# Patient Record
Sex: Female | Born: 1990 | Race: White | Hispanic: No | Marital: Single | Smoking: Never smoker
Health system: Southern US, Community
[De-identification: ages and names within clinical notes are randomized; demographics above are authoritative.]

## PROBLEM LIST (undated history)

## (undated) ENCOUNTER — Inpatient Hospital Stay (HOSPITAL_COMMUNITY): Payer: Self-pay

## (undated) DIAGNOSIS — K429 Umbilical hernia without obstruction or gangrene: Secondary | ICD-10-CM

---
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
## 2010-04-02 ENCOUNTER — Inpatient Hospital Stay (HOSPITAL_COMMUNITY)
Admission: AD | Admit: 2010-04-02 | Discharge: 2010-04-02 | Payer: Self-pay | Source: Home / Self Care | Attending: Obstetrics & Gynecology | Admitting: Obstetrics & Gynecology

## 2010-04-03 ENCOUNTER — Inpatient Hospital Stay (HOSPITAL_COMMUNITY)
Admission: AD | Admit: 2010-04-03 | Discharge: 2010-04-03 | Payer: Self-pay | Source: Home / Self Care | Attending: Obstetrics & Gynecology | Admitting: Obstetrics & Gynecology

## 2010-04-04 ENCOUNTER — Inpatient Hospital Stay (HOSPITAL_COMMUNITY)
Admission: AD | Admit: 2010-04-04 | Discharge: 2010-04-06 | Payer: Self-pay | Source: Home / Self Care | Attending: Obstetrics & Gynecology | Admitting: Obstetrics & Gynecology

## 2010-04-05 LAB — CBC
HCT: 38.2 % (ref 36.0–46.0)
Hemoglobin: 13.3 g/dL (ref 12.0–15.0)
MCH: 31.4 pg (ref 26.0–34.0)
MCHC: 34.8 g/dL (ref 30.0–36.0)
MCV: 90.3 fL (ref 78.0–100.0)
Platelets: 211 10*3/uL (ref 150–400)
RBC: 4.23 MIL/uL (ref 3.87–5.11)
RDW: 13 % (ref 11.5–15.5)
WBC: 21.4 10*3/uL — ABNORMAL HIGH (ref 4.0–10.5)

## 2010-04-05 LAB — ABO/RH: ABO/RH(D): O POS

## 2010-04-05 LAB — RPR: RPR Ser Ql: NONREACTIVE

## 2010-04-07 LAB — CBC
HCT: 33.3 % — ABNORMAL LOW (ref 36.0–46.0)
Hemoglobin: 11.1 g/dL — ABNORMAL LOW (ref 12.0–15.0)
MCH: 30.5 pg (ref 26.0–34.0)
MCHC: 33.3 g/dL (ref 30.0–36.0)
MCV: 91.5 fL (ref 78.0–100.0)
Platelets: 178 10*3/uL (ref 150–400)
RBC: 3.64 MIL/uL — ABNORMAL LOW (ref 3.87–5.11)
RDW: 13.2 % (ref 11.5–15.5)
WBC: 16.1 10*3/uL — ABNORMAL HIGH (ref 4.0–10.5)

## 2010-05-07 ENCOUNTER — Other Ambulatory Visit: Payer: Self-pay | Admitting: Obstetrics & Gynecology

## 2010-08-06 NOTE — Op Note (Signed)
NAME:  Shelley Moreno, Shelley Moreno NO.:  [id number]   MEDICAL RECORD NO.:  [id number]          PATIENT TYPE:  AMB   LOCATION:  SDS                          FACILITY:  [facility]   PHYSICIAN:  Jon Gills, M.D.  DATE OF BIRTH:  November 30, 1990   DATE OF PROCEDURE:  12/31/2004  DATE OF DISCHARGE:  12/31/2004                                 OPERATIVE REPORT   PREOPERATIVE DIAGNOSIS:  Periumbilical abdominal pain.   POSTOPERATIVE DIAGNOSIS:  Helicobacter gastritis.   OPERATION:  Upper GI endoscopy with biopsy.   SURGEON:  Jon Gills, M.D.   ASSISTANT:  None.   DESCRIPTION OF FINDINGS:  Following informed consent, the patient was taken  to the operating room and placed under general anesthesia with continuous  cardiopulmonary monitoring. She remained in the supine position and the  Olympus endoscope was passed by mouth without difficulty. There was visual  evidence of esophagitis, duodenitis or peptic ulcer disease. Several shallow  prepyloric erosions were present in the stomach. A solitary gastric biopsy  was positive for Helicobacter. Several esophageal and duodenal biopsies were  unremarkable, although  Helicobacter was confirmed histologically on gastric  biopsies. Following completion of the procedure, the patient was awakened,  taken recovery room in satisfactory condition.  She will be released to the  care of her family later today.  Shelley Moreno was given a prescription for a 14-day  course of Prevpac to treat her Helicobacter infection. A 6-week return  appointment was established so that I could continue to monitor her  progress.   DESCRIPTION TECHNICAL PROCEDURE USED:  Olympus GIF-160 endoscope with cold  biopsy forceps.   DESCRIPTION OF SPECIMENS REMOVED:  Esophagus x3 in formalin, gastrics x2 in  formalin, x1 for CLOtest, duodenum x3 in formalin.           ______________________________  Jon Gills, M.D.     JHC/MEDQ  D:  01/26/2005  T:  01/26/2005  Job:   [id number]   cc:   Maryellen Pile, M.D.  [address]  [address]  [place], [place] [address]

## 2011-05-25 ENCOUNTER — Encounter (HOSPITAL_COMMUNITY): Payer: Self-pay

## 2011-05-25 ENCOUNTER — Emergency Department (INDEPENDENT_AMBULATORY_CARE_PROVIDER_SITE_OTHER)
Admission: EM | Admit: 2011-05-25 | Discharge: 2011-05-25 | Disposition: A | Discharge status: Home / Self Care | Payer: 59 | Source: Home / Self Care | Attending: Family Medicine | Admitting: Family Medicine

## 2011-05-25 DIAGNOSIS — J329 Chronic sinusitis, unspecified: Secondary | ICD-10-CM

## 2011-05-25 DIAGNOSIS — R05 Cough: Secondary | ICD-10-CM

## 2011-05-25 MED ORDER — AZITHROMYCIN 250 MG PO TABS: 250.0000 mg | ORAL_TABLET | Freq: Every day | ORAL | Status: AC

## 2011-05-25 MED ORDER — FLUTICASONE PROPIONATE 50 MCG/ACT NA SUSP: 2.0000 | Freq: Every day | NASAL | Status: DC

## 2011-05-25 MED ORDER — HYDROCOD POLST-CHLORPHEN POLST 10-8 MG/5ML PO LQCR: ORAL | Status: DC

## 2011-05-25 MED ORDER — GUAIFENESIN-CODEINE 100-10 MG/5ML PO SYRP: 5.0000 mL | ORAL_SOLUTION | Freq: Three times a day (TID) | ORAL | Status: AC | PRN

## 2011-05-25 NOTE — ED Notes (Signed)
States she has been sick for past 3 days w cough, facial discomfort, green/yellow secretions, hearing muffled,ST; reddened post nasopharynx w miimal swelling on inspection

## 2011-05-25 NOTE — Discharge Instructions (Signed)
Take antibiotics as directed. Continue to use nasal spray as directed and as needed. Use cough syrup as needed for cough; do not drive or work while taking. I recommend aggressive fever control with acetaminophen (Tylenol) and/or ibuprofen. You may use these together, alternating them every 4 hours, or individually, every 8 hours. For example, take acetaminophen 500 to 1000 mg at 12 noon, then 600 to 800 mg of ibuprofen at 4 pm, then acetaminophen at 8 pm, etc. Also, stay hydrated with clear liquids. Return to care should your symptoms not improve, or worsen in any way.

## 2011-05-25 NOTE — ED Provider Notes (Signed)
History     CSN: [id number]  Arrival date & time 05/25/11  1229   First MD Initiated Contact with Patient 05/25/11 1334      Chief Complaint  Patient presents with  . Cough    (Consider location/radiation/quality/duration/timing/severity/associated sxs/prior treatment) HPI Comments: Memory presents for evaluation of 3 days of nasal congestion, rhinorrhea, purulent drainage, postnasal drainage, cough, sinus pressure, and bilateral ear pain. She reports a fever of 101F yesterday. She's been taking Tylenol with minimal relief.  Patient is a 21 y.o. female presenting with cough. The history is provided by the patient.  Cough This is a new problem. The current episode started more than 2 days ago. The problem occurs constantly. The cough is productive of sputum. The maximum temperature recorded prior to her arrival was 101 to 101.9 F. Associated symptoms include chills, sweats, ear congestion, ear pain, rhinorrhea and sore throat.    History reviewed. No pertinent past medical history.  History reviewed. No pertinent past surgical history.  History reviewed. No pertinent family history.  History  Substance Use Topics  . Smoking status: Never Smoker   . Smokeless tobacco: Not on file  . Alcohol Use: Yes    OB History    Grav Para Term Preterm Abortions TAB SAB Ect Mult Living                  Review of Systems  Constitutional: Positive for fever and chills.  HENT: Positive for ear pain, congestion, sore throat, rhinorrhea and sinus pressure.   Eyes: Negative.   Respiratory: Positive for cough.   Cardiovascular: Negative.   Gastrointestinal: Negative.   Genitourinary: Negative.   Musculoskeletal: Negative.   Skin: Negative.   Neurological: Negative.     Allergies  Review of patient's allergies indicates no known allergies.  Home Medications   Current Outpatient Rx  Name Route Sig Dispense Refill  . AZITHROMYCIN 250 MG PO TABS Oral Take 1 tablet (250 mg total) by  mouth daily. Take two tablets on first day, then one tablet each day for four days 6 tablet 0  . HYDROCOD POLST-CPM POLST ER 10-8 MG/5ML PO LQCR  Take 5 ml to 10 ml every 12 hours as needed for cough 140 mL 0  . FLUTICASONE PROPIONATE 50 MCG/ACT NA SUSP Nasal Place 2 sprays into the nose daily. 16 g 2    BP 154/87  Pulse 93  Temp(Src) 98.4 F (36.9 C) (Oral)  Resp 18  SpO2 100%  LMP 05/06/2011  Physical Exam  Nursing note and vitals reviewed. Constitutional: She is oriented to person, place, and time. She appears well-developed and well-nourished.  HENT:  Head: Normocephalic and atraumatic.  Right Ear: Tympanic membrane is retracted.  Left Ear: Tympanic membrane is erythematous and retracted.  Mouth/Throat: Uvula is midline and mucous membranes are normal. Posterior oropharyngeal erythema present.  Eyes: EOM are normal.  Neck: Normal range of motion.  Pulmonary/Chest: Effort normal and breath sounds normal. She has no decreased breath sounds. She has no wheezes. She has no rhonchi.  Musculoskeletal: Normal range of motion.  Neurological: She is alert and oriented to person, place, and time.  Skin: Skin is warm and dry.  Psychiatric: Her behavior is normal.    ED Course  Procedures (including critical care time)  Labs Reviewed - No data to display No results found.   1. Rhinosinusitis   2. Cough       MDM  rx given for z-pack, fluticasone, guaifenesin AC  Richardo Priest, MD 05/25/11 1546

## 2013-01-03 ENCOUNTER — Emergency Department (HOSPITAL_COMMUNITY)
Admission: EM | Admit: 2013-01-03 | Discharge: 2013-01-03 | Disposition: A | Discharge status: Home / Self Care | Payer: 59 | Attending: Emergency Medicine | Admitting: Emergency Medicine

## 2013-01-03 ENCOUNTER — Encounter (HOSPITAL_COMMUNITY): Payer: Self-pay | Admitting: Emergency Medicine

## 2013-01-03 DIAGNOSIS — O9989 Other specified diseases and conditions complicating pregnancy, childbirth and the puerperium: Secondary | ICD-10-CM | POA: Insufficient documentation

## 2013-01-03 DIAGNOSIS — J329 Chronic sinusitis, unspecified: Secondary | ICD-10-CM | POA: Insufficient documentation

## 2013-01-03 DIAGNOSIS — R05 Cough: Secondary | ICD-10-CM | POA: Insufficient documentation

## 2013-01-03 DIAGNOSIS — J029 Acute pharyngitis, unspecified: Secondary | ICD-10-CM | POA: Diagnosis not present

## 2013-01-03 DIAGNOSIS — IMO0002 Reserved for concepts with insufficient information to code with codable children: Secondary | ICD-10-CM | POA: Diagnosis not present

## 2013-01-03 DIAGNOSIS — R059 Cough, unspecified: Secondary | ICD-10-CM | POA: Insufficient documentation

## 2013-01-03 DIAGNOSIS — H9209 Otalgia, unspecified ear: Secondary | ICD-10-CM | POA: Insufficient documentation

## 2013-01-03 MED ORDER — AMOXICILLIN 500 MG PO CAPS: 500.0000 mg | ORAL_CAPSULE | Freq: Three times a day (TID) | ORAL | Status: DC

## 2013-01-03 NOTE — ED Provider Notes (Signed)
CSN: [id number]     Arrival date & time 01/03/13  2017 History   This chart was scribed for non-physician practitioner Mckinley Jewel, PA-C working with Vida Roller, MD by Clydene Laming, ED Scribe. This patient was seen in room TR04C/TR04C and the patient's care was started at 10:00 PM.    No chief complaint on file.  HPI HPI Comments: Shelley Moreno is a 22 y.o. female who presents to the Emergency Department complaining of a cough onset two days ago with an associated sore throat and bilateral ear pain. Pt reports being 3 mos pregnant. Pt denies fever or chills.   History reviewed. No pertinent past medical history. History reviewed. No pertinent past surgical history. No family history on file. History  Substance Use Topics  . Smoking status: Never Smoker   . Smokeless tobacco: Not on file  . Alcohol Use: Yes   OB History   Grav Para Term Preterm Abortions TAB SAB Ect Mult Living   1              Review of Systems  HENT: Positive for ear pain, sinus pressure and sore throat.   Respiratory: Positive for cough.   All other systems reviewed and are negative.    Allergies  Review of patient's allergies indicates no known allergies.  Home Medications   Current Outpatient Rx  Name  Route  Sig  Dispense  Refill  . guaiFENesin (ROBITUSSIN) 100 MG/5ML liquid   Oral   Take 200 mg by mouth 3 (three) times daily as needed for cough.         . pseudoephedrine (SUDAFED) 30 MG tablet   Oral   Take 60 mg by mouth every 4 (four) hours as needed for congestion.         Marland Kitchen EXPIRED: fluticasone (FLONASE) 50 MCG/ACT nasal spray   Nasal   Place 2 sprays into the nose daily.   16 g   2    Triage Vitals: BP 122/72  Pulse 88  Temp(Src) 98.8 F (37.1 C) (Oral)  Resp 18  SpO2 100% Physical Exam  Nursing note and vitals reviewed. Constitutional: She is oriented to person, place, and time. She appears well-developed and well-nourished. No distress.  HENT:  Head: Normocephalic  and atraumatic.  Eyes: EOM are normal.  Neck: Neck supple. No tracheal deviation present.  Cardiovascular: Normal rate.   Pulmonary/Chest: Effort normal. No respiratory distress.  Musculoskeletal: Normal range of motion.  Neurological: She is alert and oriented to person, place, and time.  Skin: Skin is warm and dry.  Psychiatric: She has a normal mood and affect. Her behavior is normal.    ED Course  Procedures (including critical care time) DIAGNOSTIC STUDIES: Oxygen Saturation is 100% on RA, normal by my interpretation.    COORDINATION OF CARE: 10:05 PM- Discussed treatment plan with pt at bedside. Pt verbalized understanding and agreement with plan.   Labs Review Labs Reviewed - No data to display Imaging Review No results found.  EKG Interpretation   None       MDM   1. Sinusitis     11:44 PM Patient will have amoxicillin for sinusitis which is safe in pregnancy. Vitals stable and patient afebrile. Patient instructed to return with worsening or concerning symptoms.   I personally performed the services described in this documentation, which was scribed in my presence. The recorded information has been reviewed and is accurate.     Emilia Beck, PA-C 01/03/13 2345

## 2013-01-03 NOTE — ED Notes (Signed)
Cough for few days; and bilateral ears hurt. ? Sinus infection. X 3 mos. Pregnant.

## 2013-01-04 NOTE — ED Provider Notes (Signed)
Medical screening examination/treatment/procedure(s) were performed by non-physician practitioner and as supervising physician I was immediately available for consultation/collaboration.    Vida Roller, MD 01/04/13 [phone]

## 2013-01-31 LAB — OB RESULTS CONSOLE ABO/RH: RH Type: POSITIVE

## 2013-01-31 LAB — OB RESULTS CONSOLE HEPATITIS B SURFACE ANTIGEN: Hepatitis B Surface Ag: NEGATIVE

## 2013-01-31 LAB — OB RESULTS CONSOLE GBS: GBS: NEGATIVE

## 2013-01-31 LAB — OB RESULTS CONSOLE RPR: RPR: NONREACTIVE

## 2013-01-31 LAB — OB RESULTS CONSOLE ANTIBODY SCREEN: Antibody Screen: NEGATIVE

## 2013-01-31 LAB — OB RESULTS CONSOLE RUBELLA ANTIBODY, IGM: Rubella: IMMUNE

## 2013-01-31 LAB — OB RESULTS CONSOLE GC/CHLAMYDIA
Chlamydia: NEGATIVE
Gonorrhea: NEGATIVE

## 2013-01-31 LAB — OB RESULTS CONSOLE HIV ANTIBODY (ROUTINE TESTING): HIV: NONREACTIVE

## 2013-02-11 ENCOUNTER — Inpatient Hospital Stay (HOSPITAL_COMMUNITY)
Admission: AD | Admit: 2013-02-11 | Discharge: 2013-02-11 | Disposition: A | Discharge status: Home / Self Care | Payer: 59 | Source: Ambulatory Visit | Attending: Obstetrics and Gynecology | Admitting: Obstetrics and Gynecology

## 2013-02-11 ENCOUNTER — Encounter (HOSPITAL_COMMUNITY): Payer: Self-pay | Admitting: *Deleted

## 2013-02-11 DIAGNOSIS — R142 Eructation: Secondary | ICD-10-CM | POA: Insufficient documentation

## 2013-02-11 DIAGNOSIS — R109 Unspecified abdominal pain: Secondary | ICD-10-CM | POA: Diagnosis present

## 2013-02-11 DIAGNOSIS — R141 Gas pain: Secondary | ICD-10-CM | POA: Diagnosis not present

## 2013-02-11 DIAGNOSIS — O99891 Other specified diseases and conditions complicating pregnancy: Secondary | ICD-10-CM | POA: Insufficient documentation

## 2013-02-11 DIAGNOSIS — R1032 Left lower quadrant pain: Secondary | ICD-10-CM | POA: Insufficient documentation

## 2013-02-11 DIAGNOSIS — N949 Unspecified condition associated with female genital organs and menstrual cycle: Secondary | ICD-10-CM

## 2013-02-11 LAB — URINALYSIS, ROUTINE W REFLEX MICROSCOPIC
Bilirubin Urine: NEGATIVE
Glucose, UA: NEGATIVE mg/dL
Hgb urine dipstick: NEGATIVE
Ketones, ur: NEGATIVE mg/dL
Leukocytes, UA: NEGATIVE
Nitrite: NEGATIVE
Protein, ur: NEGATIVE mg/dL
Specific Gravity, Urine: 1.015 (ref 1.005–1.030)
Urobilinogen, UA: 0.2 mg/dL (ref 0.0–1.0)
pH: 6 (ref 5.0–8.0)

## 2013-02-11 LAB — WET PREP, GENITAL
Clue Cells Wet Prep HPF POC: NONE SEEN
Trich, Wet Prep: NONE SEEN
Yeast Wet Prep HPF POC: NONE SEEN

## 2013-02-11 MED ORDER — DOCUSATE SODIUM 100 MG PO CAPS: 100.0000 mg | ORAL_CAPSULE | Freq: Every day | ORAL | Status: DC | PRN

## 2013-02-11 MED ORDER — CYCLOBENZAPRINE HCL 10 MG PO TABS: 10.0000 mg | ORAL_TABLET | Freq: Three times a day (TID) | ORAL | Status: DC | PRN

## 2013-02-11 NOTE — MAU Provider Note (Signed)
Chief Complaint: Abdominal Pain   First Provider Initiated Contact with Patient 02/11/13 2113.     SUBJECTIVE HPI: Shelley Moreno is a 22 y.o. G2P1001 at [redacted]w[redacted]d by LMP who presents with LLQ pain radiating around to left lower back since yesterday. Thinks it might be gas pains. Rates pain 5/10 on pain scale it worse. 1/10 on pain scale now. Denies vaginal bleeding, vaginal discharge, pelvic pressure, urinary complaints, fever chills, GI complaints. 2 normal bowel movements yesterday. None today. No relief with Gas-X. Temporary relief with warm compress. Feels better when she presses on it. Slightly worse w/ mvmt.   No history of short cervix or LEEP.  History reviewed. No pertinent past medical history. OB History  Gravida Para Term Preterm AB SAB TAB Ectopic Multiple Living  2 1 1       1     # Outcome Date GA Lbr Len/2nd Weight Sex Delivery Anes PTL Lv  2 CUR           1 TRM 04/04/10    F SVD EPI  Y     History reviewed. No pertinent past surgical history. History   Social History  . Marital Status: Single    Spouse Name: N/A    Number of Children: N/A  . Years of Education: N/A   Occupational History  . Not on file.   Social History Main Topics  . Smoking status: Never Smoker   . Smokeless tobacco: Not on file  . Alcohol Use: Yes  . Drug Use: Not on file  . Sexual Activity: Yes   Other Topics Concern  . Not on file   Social History Narrative  . No narrative on file   No current facility-administered medications on file prior to encounter.   No current outpatient prescriptions on file prior to encounter.   No Known Allergies  ROS: Pertinent items in HPI  OBJECTIVE Blood pressure 105/65, pulse 67, temperature 98.5 F (36.9 C), resp. rate 18, SpO2 100.00%. GENERAL: Well-developed, well-nourished female in no acute distress.  HEENT: Normocephalic HEART: normal rate RESP: normal effort ABDOMEN: Mildly distended, non-tender. Gravid, size equals date. Positive  bowel sounds x4. BACK: No CVA tenderness. EXTREMITIES: Nontender, no edema NEURO: Alert and oriented SPECULUM EXAM: NEFG, physiologic discharge, no blood noted, cervix clean BIMANUAL: cervix long and closed; uterus 18-week size, no adnexal tenderness or masses FHR 150 by doppler.  LAB RESULTS Results for orders placed during the hospital encounter of 02/11/13 (from the past 24 hour(s))  URINALYSIS, ROUTINE W REFLEX MICROSCOPIC     Status: None   Collection Time    02/11/13  8:40 PM      Result Value Range   Color, Urine YELLOW  YELLOW   APPearance CLEAR  CLEAR   Specific Gravity, Urine 1.015  1.005 - 1.030   pH 6.0  5.0 - 8.0   Glucose, UA NEGATIVE  NEGATIVE mg/dL   Hgb urine dipstick NEGATIVE  NEGATIVE   Bilirubin Urine NEGATIVE  NEGATIVE   Ketones, ur NEGATIVE  NEGATIVE mg/dL   Protein, ur NEGATIVE  NEGATIVE mg/dL   Urobilinogen, UA 0.2  0.0 - 1.0 mg/dL   Nitrite NEGATIVE  NEGATIVE   Leukocytes, UA NEGATIVE  NEGATIVE   IMAGING No results found.  MAU COURSE  ASSESSMENT 1. Abdominal gas pain   2. Round ligament pain    PLAN Discharge home per consult with Dr. Dareen Piano. Many ultrasound if symptoms worsen.     Follow-up Information   Follow up with  [facility]. (As scheduled or as needed if symptoms worsen)    Contact information:   [address] [place] [address] [phone]      Follow up with [facility]. (As needed emergencies)    Contact information:   [id number] [place] [address] [phone]       Medication List         cyclobenzaprine 10 MG tablet  Commonly known as:  FLEXERIL  Take 1 tablet (10 mg total) by mouth 3 (three) times daily as needed for muscle spasms.     docusate sodium 100 MG capsule  Commonly known as:  COLACE  Take 1 capsule (100 mg total) by mouth daily as needed for mild constipation or  moderate constipation. Take daily for one week, then as needed.     prenatal multivitamin Tabs tablet  Take 1 tablet by mouth daily at 12 noon.     simethicone 80 MG chewable tablet  Commonly known as:  MYLICON  Chew 80 mg by mouth once.         [place], CNM 02/11/2013  9:52 PM

## 2013-02-11 NOTE — MAU Note (Signed)
Started having abdominal pain yesterday afternoon that travel to lower back. Eased up after applying a heating pad and shower. The pain returned today. Not sure if the pain is from gas. Took Gas-X

## 2013-02-12 LAB — GC/CHLAMYDIA PROBE AMP
CT Probe RNA: NEGATIVE
GC Probe RNA: NEGATIVE

## 2013-03-21 NOTE — L&D Delivery Note (Signed)
Pt completed the first stage without complications. She pushed briefly and had a SVD of one live viable white female infant over an intact perineum in the ROA position. Nuchal cord x 1. Placenta S/I. EBL-400cc.

## 2013-07-18 ENCOUNTER — Encounter (HOSPITAL_COMMUNITY): Payer: Self-pay | Admitting: *Deleted

## 2013-07-18 ENCOUNTER — Telehealth (HOSPITAL_COMMUNITY): Payer: Self-pay | Admitting: *Deleted

## 2013-07-18 NOTE — Telephone Encounter (Signed)
Preadmission screen  

## 2013-07-19 ENCOUNTER — Encounter (HOSPITAL_COMMUNITY): Payer: Self-pay | Admitting: *Deleted

## 2013-07-19 ENCOUNTER — Inpatient Hospital Stay (HOSPITAL_COMMUNITY)
Admission: AD | Admit: 2013-07-19 | Discharge: 2013-07-20 | DRG: 775 | Disposition: A | Discharge status: Home / Self Care | Payer: 59 | Source: Ambulatory Visit | Attending: Obstetrics and Gynecology | Admitting: Obstetrics and Gynecology

## 2013-07-19 ENCOUNTER — Encounter (HOSPITAL_COMMUNITY): Payer: 59 | Admitting: Anesthesiology

## 2013-07-19 ENCOUNTER — Inpatient Hospital Stay (HOSPITAL_COMMUNITY): Payer: 59 | Admitting: Anesthesiology

## 2013-07-19 DIAGNOSIS — Z3480 Encounter for supervision of other normal pregnancy, unspecified trimester: Secondary | ICD-10-CM

## 2013-07-19 DIAGNOSIS — IMO0001 Reserved for inherently not codable concepts without codable children: Secondary | ICD-10-CM

## 2013-07-19 DIAGNOSIS — O479 False labor, unspecified: Secondary | ICD-10-CM | POA: Diagnosis present

## 2013-07-19 LAB — CBC
HCT: 37.2 % (ref 36.0–46.0)
HEMOGLOBIN: 13 g/dL (ref 12.0–15.0)
MCH: 31.6 pg (ref 26.0–34.0)
MCHC: 34.9 g/dL (ref 30.0–36.0)
MCV: 90.5 fL (ref 78.0–100.0)
Platelets: 161 10*3/uL (ref 150–400)
RBC: 4.11 MIL/uL (ref 3.87–5.11)
RDW: 13.3 % (ref 11.5–15.5)
WBC: 22.9 10*3/uL — AB (ref 4.0–10.5)

## 2013-07-19 LAB — TYPE AND SCREEN
ABO/RH(D): O POS
ANTIBODY SCREEN: NEGATIVE

## 2013-07-19 LAB — RPR

## 2013-07-19 MED ORDER — LIDOCAINE HCL (PF) 1 % IJ SOLN
30.0000 mL | INTRAMUSCULAR | Status: DC | PRN
  Filled 2013-07-19: qty 30

## 2013-07-19 MED ORDER — DIPHENHYDRAMINE HCL 50 MG/ML IJ SOLN: 12.5000 mg | INTRAMUSCULAR | Status: DC | PRN

## 2013-07-19 MED ORDER — ZOLPIDEM TARTRATE 5 MG PO TABS: 5.0000 mg | ORAL_TABLET | Freq: Every evening | ORAL | Status: DC | PRN

## 2013-07-19 MED ORDER — FENTANYL 2.5 MCG/ML BUPIVACAINE 1/10 % EPIDURAL INFUSION (WH - ANES)
INTRAMUSCULAR | Status: DC | PRN
  Administered 2013-07-19: 14.5 mL/h via EPIDURAL

## 2013-07-19 MED ORDER — LACTATED RINGERS IV SOLN: 500.0000 mL | Freq: Once | INTRAVENOUS | Status: DC

## 2013-07-19 MED ORDER — PHENYLEPHRINE 40 MCG/ML (10ML) SYRINGE FOR IV PUSH (FOR BLOOD PRESSURE SUPPORT): 80.0000 ug | PREFILLED_SYRINGE | INTRAVENOUS | Status: DC | PRN

## 2013-07-19 MED ORDER — BENZOCAINE-MENTHOL 20-0.5 % EX AERO
1.0000 "application " | INHALATION_SPRAY | CUTANEOUS | Status: DC | PRN
  Administered 2013-07-19: 1 via TOPICAL
  Filled 2013-07-19 (×2): qty 56

## 2013-07-19 MED ORDER — OXYTOCIN 40 UNITS IN LACTATED RINGERS INFUSION - SIMPLE MED
62.5000 mL/h | INTRAVENOUS | Status: DC
  Administered 2013-07-19: 999 mL/h via INTRAVENOUS
  Filled 2013-07-19: qty 1000

## 2013-07-19 MED ORDER — ONDANSETRON HCL 4 MG/2ML IJ SOLN: 4.0000 mg | INTRAMUSCULAR | Status: DC | PRN

## 2013-07-19 MED ORDER — SIMETHICONE 80 MG PO CHEW: 80.0000 mg | CHEWABLE_TABLET | ORAL | Status: DC | PRN

## 2013-07-19 MED ORDER — EPHEDRINE 5 MG/ML INJ: 10.0000 mg | INTRAVENOUS | Status: DC | PRN

## 2013-07-19 MED ORDER — LACTATED RINGERS IV SOLN
INTRAVENOUS | Status: DC
Start: 2013-07-19 — End: 2013-07-19
  Administered 2013-07-19: 10:00:00 via INTRAVENOUS

## 2013-07-19 MED ORDER — OXYTOCIN BOLUS FROM INFUSION: 500.0000 mL | INTRAVENOUS | Status: DC

## 2013-07-19 MED ORDER — DIBUCAINE 1 % RE OINT
1.0000 "application " | TOPICAL_OINTMENT | RECTAL | Status: DC | PRN
  Filled 2013-07-19: qty 28

## 2013-07-19 MED ORDER — ONDANSETRON HCL 4 MG/2ML IJ SOLN: 4.0000 mg | Freq: Four times a day (QID) | INTRAMUSCULAR | Status: DC | PRN

## 2013-07-19 MED ORDER — EPHEDRINE 5 MG/ML INJ
INTRAVENOUS | Status: AC
  Filled 2013-07-19: qty 4

## 2013-07-19 MED ORDER — LACTATED RINGERS IV SOLN
500.0000 mL | INTRAVENOUS | Status: DC | PRN
  Administered 2013-07-19: 1000 mL via INTRAVENOUS

## 2013-07-19 MED ORDER — FENTANYL 2.5 MCG/ML BUPIVACAINE 1/10 % EPIDURAL INFUSION (WH - ANES): 14.5000 mL/h | INTRAMUSCULAR | Status: DC | PRN

## 2013-07-19 MED ORDER — FENTANYL 2.5 MCG/ML BUPIVACAINE 1/10 % EPIDURAL INFUSION (WH - ANES): 14.0000 mL/h | INTRAMUSCULAR | Status: DC | PRN

## 2013-07-19 MED ORDER — ONDANSETRON HCL 4 MG PO TABS: 4.0000 mg | ORAL_TABLET | ORAL | Status: DC | PRN

## 2013-07-19 MED ORDER — CITRIC ACID-SODIUM CITRATE 334-500 MG/5ML PO SOLN: 30.0000 mL | ORAL | Status: DC | PRN

## 2013-07-19 MED ORDER — PHENYLEPHRINE 40 MCG/ML (10ML) SYRINGE FOR IV PUSH (FOR BLOOD PRESSURE SUPPORT)
PREFILLED_SYRINGE | INTRAVENOUS | Status: AC
  Filled 2013-07-19: qty 10

## 2013-07-19 MED ORDER — FENTANYL 2.5 MCG/ML BUPIVACAINE 1/10 % EPIDURAL INFUSION (WH - ANES)
INTRAMUSCULAR | Status: AC
  Filled 2013-07-19: qty 125

## 2013-07-19 MED ORDER — MEASLES, MUMPS & RUBELLA VAC ~~LOC~~ INJ: 0.5000 mL | INJECTION | Freq: Once | SUBCUTANEOUS | Status: DC

## 2013-07-19 MED ORDER — IBUPROFEN 600 MG PO TABS: 600.0000 mg | ORAL_TABLET | Freq: Four times a day (QID) | ORAL | Status: DC | PRN

## 2013-07-19 MED ORDER — LIDOCAINE HCL (PF) 1 % IJ SOLN
INTRAMUSCULAR | Status: DC | PRN
  Administered 2013-07-19 (×2): 4 mL

## 2013-07-19 MED ORDER — OXYCODONE-ACETAMINOPHEN 5-325 MG PO TABS: 1.0000 | ORAL_TABLET | ORAL | Status: DC | PRN

## 2013-07-19 MED ORDER — ACETAMINOPHEN 325 MG PO TABS: 650.0000 mg | ORAL_TABLET | ORAL | Status: DC | PRN

## 2013-07-19 MED ORDER — IBUPROFEN 600 MG PO TABS
600.0000 mg | ORAL_TABLET | Freq: Four times a day (QID) | ORAL | Status: DC
  Administered 2013-07-19 – 2013-07-20 (×5): 600 mg via ORAL
  Filled 2013-07-19 (×5): qty 1

## 2013-07-19 MED ORDER — FLEET ENEMA 7-19 GM/118ML RE ENEM: 1.0000 | ENEMA | RECTAL | Status: DC | PRN

## 2013-07-19 MED ORDER — TETANUS-DIPHTH-ACELL PERTUSSIS 5-2.5-18.5 LF-MCG/0.5 IM SUSP
0.5000 mL | Freq: Once | INTRAMUSCULAR | Status: DC
  Filled 2013-07-19: qty 0.5

## 2013-07-19 MED ORDER — SENNOSIDES-DOCUSATE SODIUM 8.6-50 MG PO TABS
2.0000 | ORAL_TABLET | ORAL | Status: DC
  Administered 2013-07-19: 2 via ORAL
  Filled 2013-07-19: qty 2

## 2013-07-19 MED ORDER — OXYCODONE-ACETAMINOPHEN 5-325 MG PO TABS
1.0000 | ORAL_TABLET | ORAL | Status: DC | PRN
  Administered 2013-07-19 – 2013-07-20 (×3): 1 via ORAL
  Filled 2013-07-19 (×3): qty 1

## 2013-07-19 MED ORDER — WITCH HAZEL-GLYCERIN EX PADS
1.0000 | MEDICATED_PAD | CUTANEOUS | Status: DC | PRN
Start: 2013-07-19 — End: 2013-07-20

## 2013-07-19 NOTE — MAU Note (Signed)
PT SAYS SHE HURT BAD SINCE 0500.    VE IN OFFICE  ON Thursday  3 CM.Marland Kitchen.    DENIES HSV AND MRSA.

## 2013-07-19 NOTE — Anesthesia Preprocedure Evaluation (Signed)
Anesthesia Evaluation  Patient identified by MRN, date of birth, ID band Patient awake    Reviewed: Allergy & Precautions, H&P , Patient's Chart, lab work & pertinent test results  Airway Mallampati: II TM Distance: >3 FB Neck ROM: Full    Dental no notable dental hx. (+) Poor Dentition   Pulmonary former smoker,  breath sounds clear to auscultation  Pulmonary exam normal       Cardiovascular negative cardio ROS  Rhythm:Regular Rate:Normal     Neuro/Psych negative neurological ROS  negative psych ROS   GI/Hepatic Neg liver ROS, GERD-  Medicated and Controlled,  Endo/Other  Obesity  Renal/GU negative Renal ROS  negative genitourinary   Musculoskeletal negative musculoskeletal ROS (+)   Abdominal   Peds  Hematology negative hematology ROS (+)   Anesthesia Other Findings   Reproductive/Obstetrics (+) Pregnancy                           Anesthesia Physical Anesthesia Plan  ASA: II  Anesthesia Plan: Epidural   Post-op Pain Management:    Induction:   Airway Management Planned: Natural Airway  Additional Equipment:   Intra-op Plan:   Post-operative Plan:   Informed Consent: I have reviewed the patients History and Physical, chart, labs and discussed the procedure including the risks, benefits and alternatives for the proposed anesthesia with the patient or authorized representative who has indicated his/her understanding and acceptance.     Plan Discussed with: Anesthesiologist  Anesthesia Plan Comments:         Anesthesia Quick Evaluation

## 2013-07-19 NOTE — H&P (Signed)
Pt is a 23 y/o white female, G2P1001 at term who presents to L&D c/o labor. PNC was uncomplicated.  PMHX: See hollister PE: VSSAF        HEENT-wnl        Abd-gravid, non tender        Cx-70/5/-2        FHTs- reactive IMP/ IUP at term, in labor Plan/ Admit.

## 2013-07-19 NOTE — MAU Note (Signed)
Contractions  

## 2013-07-19 NOTE — Anesthesia Procedure Notes (Signed)
Epidural Patient location during procedure: OB Start time: 07/19/2013 10:15 AM  Staffing Anesthesiologist: Tionne Dayhoff A. Performed by: anesthesiologist   Preanesthetic Checklist Completed: patient identified, site marked, surgical consent, pre-op evaluation, timeout performed, IV checked, risks and benefits discussed and monitors and equipment checked  Epidural Patient position: sitting Prep: site prepped and draped and DuraPrep Patient monitoring: continuous pulse ox and blood pressure Approach: midline Location: L3-L4 Injection technique: LOR air  Needle:  Needle type: Tuohy  Needle gauge: 17 G Needle length: 9 cm and 9 Needle insertion depth: 7 cm Catheter type: closed end flexible Catheter size: 19 Gauge Catheter at skin depth: 12 cm Test dose: negative and Other  Assessment Events: blood not aspirated, injection not painful, no injection resistance, negative IV test and no paresthesia  Additional Notes Patient identified. Risks and benefits discussed including failed block, incomplete  Pain control, post dural puncture headache, nerve damage, paralysis, blood pressure Changes, nausea, vomiting, reactions to medications-both toxic and allergic and post Partum back pain. All questions were answered. Patient expressed understanding and wished to proceed. Sterile technique was used throughout procedure. Epidural site was Dressed with sterile barrier dressing. No paresthesias, signs of intravascular injection Or signs of intrathecal spread were encountered.  Patient was more comfortable after the epidural was dosed. Please see RN's note for documentation of vital signs and FHR which are stable.

## 2013-07-20 LAB — CBC
HCT: 33.4 % — ABNORMAL LOW (ref 36.0–46.0)
Hemoglobin: 11.2 g/dL — ABNORMAL LOW (ref 12.0–15.0)
MCH: 30.9 pg (ref 26.0–34.0)
MCHC: 33.5 g/dL (ref 30.0–36.0)
MCV: 92 fL (ref 78.0–100.0)
Platelets: 156 10*3/uL (ref 150–400)
RBC: 3.63 MIL/uL — ABNORMAL LOW (ref 3.87–5.11)
RDW: 13.5 % (ref 11.5–15.5)
WBC: 18.7 10*3/uL — ABNORMAL HIGH (ref 4.0–10.5)

## 2013-07-20 MED ORDER — OXYCODONE-ACETAMINOPHEN 5-325 MG PO TABS: 1.0000 | ORAL_TABLET | ORAL | Status: DC | PRN

## 2013-07-20 NOTE — Anesthesia Postprocedure Evaluation (Signed)
Anesthesia Post Note  Patient: Shelley Moreno  Procedure(s) Performed: * No procedures listed *  Anesthesia type: Epidural  Patient location: Mother/Baby  Post pain: Pain level controlled  Post assessment: Post-op Vital signs reviewed  Last Vitals:  Filed Vitals:   07/20/13 0550  BP: 102/65  Pulse: 70  Temp: 36.8 C  Resp: 18    Post vital signs: Reviewed  Level of consciousness:alert  Complications: No apparent anesthesia complications

## 2013-07-20 NOTE — Lactation Note (Signed)
This note was copied from the chart of Shelley Corliss ParishLaura Lindeman. Lactation Consultation Note: Initial visit with this experienced BF mom. She had baby latched to breast when I went into room. He is sleepy now- few sucks noted. Mom reports that he has been nursing well but sleepy at some feedings. Reviewed normal newborn behavior the first 24 hours. Discussed feeding cues and cluster feeding with mom. Mom reports no pain with latch. BF brochure given with resources for support after DC. Reviewed BFSG and OP appointments as recsurces for support after DC. To call prn  Patient Name: Shelley Moreno ZOXWR'UToday's Date: 07/20/2013 Reason for consult: Initial assessment   Maternal Data Formula Feeding for Exclusion: No Infant to breast within first hour of birth: Yes Has patient been taught Hand Expression?: Yes Does the patient have breastfeeding experience prior to this delivery?: Yes  Feeding Feeding Type: Breast Fed Length of feed: 5 min  LATCH Score/Interventions Latch: Grasps breast easily, tongue down, lips flanged, rhythmical sucking. (getting sleepy- few sucks now)  Audible Swallowing: None  Type of Nipple: Everted at rest and after stimulation  Comfort (Breast/Nipple): Soft / non-tender     Hold (Positioning): No assistance needed to correctly position infant at breast.  LATCH Score: 8  Lactation Tools Discussed/Used     Consult Status Consult Status: PRN    Pamelia HoitDonna D Areebah Meinders 07/20/2013, 10:54 AM

## 2013-07-20 NOTE — Discharge Summary (Signed)
Obstetric Discharge Summary Reason for Admission: onset of labor Prenatal Procedures: none Intrapartum Procedures: spontaneous vaginal delivery Postpartum Procedures: none Complications-Operative and Postpartum: none Hemoglobin  Date Value Ref Range Status  07/20/2013 11.2* 12.0 - 15.0 g/dL Final     HCT  Date Value Ref Range Status  07/20/2013 33.4* 36.0 - 46.0 % Final    Physical Exam:  General: alert Lochia: appropriate Uterine Fundus: firm   Discharge Diagnoses: Term Pregnancy-delivered  Discharge Information: Date: 07/20/2013 Activity: pelvic rest Diet: routine Medications: PNV, Ibuprofen and Percocet Condition: stable Instructions: refer to practice specific booklet Discharge to: home Follow-up Information   Follow up with Levi AlandANDERSON,Nilay Mangrum E, MD In 4 weeks.   Specialty:  Obstetrics and Gynecology   Contact information:   [address] [place] [id number] [phone]       Newborn Data: Live born female  Birth Weight: 9 lb 7 oz (4281 g) APGAR: 7, 8  Home with mother.  Levi AlandMark E Idriss Quackenbush 07/20/2013, 7:48 AM

## 2013-07-20 NOTE — Progress Notes (Signed)
PPD#1 Pt without complaints. Would like to go home.  VSSAF IMP/ stable Plan/Will discharge to home.

## 2013-07-25 ENCOUNTER — Inpatient Hospital Stay (HOSPITAL_COMMUNITY): Admission: RE | Admit: 2013-07-25 | Payer: 59 | Source: Ambulatory Visit

## 2014-01-20 ENCOUNTER — Encounter (HOSPITAL_COMMUNITY): Payer: Self-pay | Admitting: *Deleted

## 2015-03-22 NOTE — L&D Delivery Note (Signed)
Delivery Note At 11:59 AM a viable female was delivered via Vaginal, Spontaneous Delivery (Presentation: Right occiput anterior ).  APGAR: 9, 10; weight pending .   Placenta status: Spontaneous and in tact.  Cord: 3 vessels with the following complications: none .  Cord pH: n/a  Anesthesia:  Epidural Episiotomy: None Lacerations: None Suture Repair: n/a Est. Blood Loss (mL): 100  Mom to postpartum.  Baby to Couplet care / Skin to Skin.  Shelley Moreno [name] Shelley Moreno 10/24/2015, 12:19 PM

## 2015-04-01 LAB — OB RESULTS CONSOLE GC/CHLAMYDIA
Chlamydia: NEGATIVE
Gonorrhea: NEGATIVE

## 2015-04-01 LAB — OB RESULTS CONSOLE ANTIBODY SCREEN: Antibody Screen: NEGATIVE

## 2015-04-01 LAB — OB RESULTS CONSOLE ABO/RH: RH TYPE: POSITIVE

## 2015-04-01 LAB — OB RESULTS CONSOLE RUBELLA ANTIBODY, IGM: Rubella: IMMUNE

## 2015-04-01 LAB — OB RESULTS CONSOLE RPR: RPR: NONREACTIVE

## 2015-04-01 LAB — OB RESULTS CONSOLE HEPATITIS B SURFACE ANTIGEN: HEP B S AG: NEGATIVE

## 2015-04-01 LAB — OB RESULTS CONSOLE HIV ANTIBODY (ROUTINE TESTING): HIV: NONREACTIVE

## 2015-06-29 ENCOUNTER — Encounter (HOSPITAL_COMMUNITY): Payer: Self-pay | Admitting: Emergency Medicine

## 2015-06-29 ENCOUNTER — Ambulatory Visit (HOSPITAL_COMMUNITY)
Admission: EM | Admit: 2015-06-29 | Discharge: 2015-06-29 | Disposition: A | Discharge status: Home / Self Care | Payer: Commercial Managed Care - PPO | Attending: Emergency Medicine | Admitting: Emergency Medicine

## 2015-06-29 DIAGNOSIS — J069 Acute upper respiratory infection, unspecified: Secondary | ICD-10-CM

## 2015-06-29 MED ORDER — AZITHROMYCIN 250 MG PO TABS: ORAL_TABLET | ORAL | Status: DC

## 2015-06-29 NOTE — ED Provider Notes (Signed)
CSN: [id number]     Arrival date & time 06/29/15  1727 History   First MD Initiated Contact with Patient 06/29/15 1844     Chief Complaint  Patient presents with  . Cough  . Nasal Congestion   (Consider location/radiation/quality/duration/timing/severity/associated sxs/prior Treatment) HPI She is a 25 year old woman here for evaluation of cough. She reports 2-3 weeks of cough, nasal congestion, and runny nose. She reports some low-grade fevers of 99.1. She denies any shortness of breath. Cough is productive of clear to white sputum. No ear pain or sore throat. Multiple coworkers have been sick with flu and bronchitis. She has been taking Delsym and Sudafed without much improvement.  She is [redacted] weeks pregnant.  History reviewed. No pertinent past medical history. History reviewed. No pertinent past surgical history. History reviewed. No pertinent family history. Social History  Substance Use Topics  . Smoking status: Former Games developermoker  . Smokeless tobacco: None  . Alcohol Use: Yes   OB History    Gravida Para Term Preterm AB TAB SAB Ectopic Multiple Living   3 2 2       2      Review of Systems As in history of present illness Allergies  Review of patient's allergies indicates no known allergies.  Home Medications   Prior to Admission medications   Medication Sig Start Date End Date Taking? Authorizing Provider  azithromycin (ZITHROMAX Z-PAK) 250 MG tablet Take 2 pills today, then 1 pill daily until gone. 06/29/15   Charm RingsErin J Caera Enwright, MD  Multiple Vitamin (MULTIVITAMIN WITH MINERALS) TABS tablet Take 1 tablet by mouth daily.    Historical Provider, MD  oxyCODONE-acetaminophen (PERCOCET/ROXICET) 5-325 MG per tablet Take 1-2 tablets by mouth every 4 (four) hours as needed for severe pain (moderate - severe pain). 07/20/13   Levi AlandMark E Anderson, MD   Meds Ordered and Administered this Visit  Medications - No data to display  BP 119/77 mmHg  Pulse 90  Temp(Src) 98.6 F (37 C) (Oral)  Resp 16   SpO2 100%  Breastfeeding? No No data found.   Physical Exam  Constitutional: She is oriented to person, place, and time. She appears well-developed and well-nourished. No distress.  HENT:  Mouth/Throat: No oropharyngeal exudate.  Nasal discharge present. Moderate clear postnasal drainage.  Neck: Neck supple.  Cardiovascular: Normal rate, regular rhythm and normal heart sounds.   No murmur heard. Pulmonary/Chest: Effort normal and breath sounds normal. No respiratory distress. She has no wheezes. She has no rales.  Lymphadenopathy:    She has no cervical adenopathy.  Neurological: She is alert and oriented to person, place, and time.    ED Course  Procedures (including critical care time)  Labs Review Labs Reviewed - No data to display  Imaging Review No results found.    MDM   1. URI (upper respiratory infection)    Discussed symptomatic treatment. Given duration of symptoms will cover with azithromycin. Return precautions reviewed.    Charm RingsErin J Alaina Donati, MD 06/29/15 1919

## 2015-06-29 NOTE — ED Notes (Signed)
[facility] a [facility] of [facility] cough and runny nose x 3 weeks.

## 2015-06-29 NOTE — Discharge Instructions (Signed)
You do not have flu or bronchitis. With your pregnancy, you are just taking a long time to recover from an upper respiratory infection. Take azithromycin as prescribed. Get Zyrtec, Claritin, or Allegra over-the-counter to help with the congestion and runny nose. These are safe in pregnancy. Try mixing equal parts honey, water, and lemon juice for home cough syrup. Follow-up as needed.

## 2015-07-25 ENCOUNTER — Inpatient Hospital Stay (HOSPITAL_COMMUNITY)
Admission: AD | Admit: 2015-07-25 | Discharge: 2015-07-25 | Disposition: A | Discharge status: Home / Self Care | Payer: Commercial Managed Care - PPO | Source: Ambulatory Visit | Attending: Obstetrics and Gynecology | Admitting: Obstetrics and Gynecology

## 2015-07-25 ENCOUNTER — Encounter (HOSPITAL_COMMUNITY): Payer: Self-pay | Admitting: *Deleted

## 2015-07-25 DIAGNOSIS — Z3A25 25 weeks gestation of pregnancy: Secondary | ICD-10-CM | POA: Insufficient documentation

## 2015-07-25 DIAGNOSIS — O2342 Unspecified infection of urinary tract in pregnancy, second trimester: Secondary | ICD-10-CM

## 2015-07-25 DIAGNOSIS — Z87891 Personal history of nicotine dependence: Secondary | ICD-10-CM | POA: Diagnosis not present

## 2015-07-25 DIAGNOSIS — R319 Hematuria, unspecified: Secondary | ICD-10-CM | POA: Diagnosis present

## 2015-07-25 HISTORY — DX: Umbilical hernia without obstruction or gangrene: K42.9

## 2015-07-25 LAB — URINALYSIS, ROUTINE W REFLEX MICROSCOPIC
Bilirubin Urine: NEGATIVE
Glucose, UA: NEGATIVE mg/dL
Ketones, ur: NEGATIVE mg/dL
Nitrite: NEGATIVE
PROTEIN: NEGATIVE mg/dL
Specific Gravity, Urine: 1.01 (ref 1.005–1.030)
pH: 7 (ref 5.0–8.0)

## 2015-07-25 LAB — URINE MICROSCOPIC-ADD ON

## 2015-07-25 MED ORDER — PHENAZOPYRIDINE HCL 200 MG PO TABS: 200.0000 mg | ORAL_TABLET | Freq: Three times a day (TID) | ORAL | Status: DC | PRN

## 2015-07-25 MED ORDER — NITROFURANTOIN MONOHYD MACRO 100 MG PO CAPS: 100.0000 mg | ORAL_CAPSULE | Freq: Two times a day (BID) | ORAL | Status: DC

## 2015-07-25 NOTE — Discharge Instructions (Signed)
Pregnancy and Urinary Tract Infection  A urinary tract infection (UTI) is a bacterial infection of the urinary tract. Infection of the urinary tract can include the ureters, kidneys (pyelonephritis), bladder (cystitis), and urethra (urethritis). All pregnant women should be screened for bacteria in the urinary tract. Identifying and treating a UTI will decrease the risk of preterm labor and developing more serious infections in both the mother and baby.  CAUSES  Bacteria germs cause almost all UTIs.   RISK FACTORS  Many factors can increase your chances of getting a UTI during pregnancy. These include:  · Having a short urethra.  · Poor toilet and hygiene habits.  · Sexual intercourse.  · Blockage of urine along the urinary tract.  · Problems with the pelvic muscles or nerves.  · Diabetes.  · Obesity.  · Bladder problems after having several children.  · Previous history of UTI.  SIGNS AND SYMPTOMS   · Pain, burning, or a stinging feeling when urinating.  · Suddenly feeling the need to urinate right away (urgency).  · Loss of bladder control (urinary incontinence).  · Frequent urination, more than is common with pregnancy.  · Lower abdominal or back discomfort.  · Cloudy urine.  · Blood in the urine (hematuria).  · Fever.   When the kidneys are infected, the symptoms may be:  · Back pain.  · Flank pain on the right side more so than the left.  · Fever.  · Chills.  · Nausea.  · Vomiting.  DIAGNOSIS   A urinary tract infection is usually diagnosed through urine tests. Additional tests and procedures are sometimes done. These may include:  · Ultrasound exam of the kidneys, ureters, bladder, and urethra.  · Looking in the bladder with a lighted tube (cystoscopy).  TREATMENT  Typically, UTIs can be treated with antibiotic medicines.   HOME CARE INSTRUCTIONS   · Only take over-the-counter or prescription medicines as directed by your health care provider. If you were prescribed antibiotics, take them as directed. Finish  them even if you start to feel better.  · Drink enough fluids to keep your urine clear or pale yellow.  · Do not have sexual intercourse until the infection is gone and your health care provider says it is okay.  · Make sure you are tested for UTIs throughout your pregnancy. These infections often come back.   Preventing a UTI in the Future  · Practice good toilet habits. Always wipe from front to back. Use the tissue only once.  · Do not hold your urine. Empty your bladder as soon as possible when the urge comes.  · Do not douche or use deodorant sprays.  · Wash with soap and warm water around the genital area and the anus.  · Empty your bladder before and after sexual intercourse.  · Wear underwear with a cotton crotch.  · Avoid caffeine and carbonated drinks. They can irritate the bladder.  · Drink cranberry juice or take cranberry pills. This may decrease the risk of getting a UTI.  · Do not drink alcohol.  · Keep all your appointments and tests as scheduled.   SEEK MEDICAL CARE IF:   · Your symptoms get worse.  · You are still having fevers 2 or more days after treatment begins.  · You have a rash.  · You feel that you are having problems with medicines prescribed.  · You have abnormal vaginal discharge.  SEEK IMMEDIATE MEDICAL CARE IF:   · You have back or flank   pain.  · You have chills.  · You have blood in your urine.  · You have nausea and vomiting.  · You have contractions of your uterus.  · You have a gush of fluid from the vagina.  MAKE SURE YOU:  · Understand these instructions.    · Will watch your condition.    · Will get help right away if you are not doing well or get worse.       This information is not intended to replace advice given to you by your health care provider. Make sure you discuss any questions you have with your health care provider.     Document Released: 07/02/2010 Document Revised: 12/26/2012 Document Reviewed: 10/04/2012  Elsevier Interactive Patient Education ©2016 [employer]  Inc[employer]

## 2015-07-25 NOTE — MAU Note (Signed)
Pt reports she noticed yesterday her urine was blood tinged. Having a little bit of burning. Started having abd cramping this morning.

## 2015-07-25 NOTE — MAU Provider Note (Signed)
Chief Complaint:  Hematuria and Abdominal Cramping   First Provider Initiated Contact with Patient 07/25/15 1652      HPI: Shelley Moreno is a 25 y.o. G3P2002 at 4486w6d who presents to maternity admissions reporting seeing blood in her urine since yesterday afternoon and suprapubic cramping since last night. Called after-hours number. Was told come to maternity admissions for evaluation..  Location: Suprapubic Quality: Cramping Severity: Mild Duration: Less than 24 hours Context: With hematuria Timing: Intermittent Modifying factors: none Associated signs and symptoms: Positive for hematuria, frequency. Negative for fever, chills, flank pain, vaginal bleeding, vaginal discharge, leaking of fluid.  Denies contractions. Good fetal movement.    Past Medical History: Past Medical History  Diagnosis Date  . Umbilical hernia     Past obstetric history: OB History  Gravida Para Term Preterm AB SAB TAB Ectopic Multiple Living  3 2 2       2     # Outcome Date GA Lbr Len/2nd Weight Sex Delivery Anes PTL Lv  3 Current           2 Term 07/19/13 3336w0d / 00:28 9 lb 7 oz (4.281 kg) M Vag-Spont EPI  Y  1 Term 04/04/10 3929w0d  8 lb 13 oz (3.997 kg) F Vag-Spont EPI  Y      Past Surgical History: History reviewed. No pertinent past surgical history.   Family History: History reviewed. No pertinent family history.  Social History: Social History  Substance Use Topics  . Smoking status: Former Games developermoker  . Smokeless tobacco: None  . Alcohol Use: No    Allergies: No Known Allergies  Meds:  No prescriptions prior to admission    I have reviewed patient's Past Medical Hx, Surgical Hx, Family Hx, Social Hx, medications and allergies.   ROS:  Review of Systems  Constitutional: Negative for fever and chills.  Gastrointestinal: Positive for abdominal pain. Negative for nausea, vomiting, diarrhea and constipation.  Genitourinary: Positive for dysuria, frequency and hematuria. Negative  for urgency, flank pain, vaginal bleeding and vaginal discharge.  Musculoskeletal: Negative for back pain.    Physical Exam  Patient Vitals for the past 24 hrs:  BP Temp Temp src Pulse Resp  07/25/15 1522 107/63 mmHg 98.4 F (36.9 C) Oral 85 18   Constitutional: Well-developed, well-nourished female in no acute distress.  Cardiovascular: normal rate Respiratory: normal effort GI: Abd soft, non-tender, gravid appropriate for gestational age. MS: Extremities nontender, no edema, normal ROM Neurologic: Alert and oriented x 4.  GU: Neg CVAT.  Pelvic: NEFG, physiologic discharge, no blood, cervix clean.   Dilation: Closed Effacement (%): Thick Cervical Position: Posterior Exam by:: V.Tuwana Kapaun,CNM  FHT:  Baseline 140 , moderate variability, no accelerations present, no decelerations. Reassuring for gestational age Contractions: None   Labs: Results for orders placed or performed during the hospital encounter of 07/25/15 (from the past 24 hour(s))  Urinalysis, Routine w reflex microscopic ([facility])     Status: Abnormal   Collection Time: 07/25/15  3:00 PM  Result Value Ref Range   Color, Urine RED (A) YELLOW   APPearance HAZY (A) CLEAR   Specific Gravity, Urine 1.010 1.005 - 1.030   pH 7.0 5.0 - 8.0   Glucose, UA NEGATIVE NEGATIVE mg/dL   Hgb urine dipstick LARGE (A) NEGATIVE   Bilirubin Urine NEGATIVE NEGATIVE   Ketones, ur NEGATIVE NEGATIVE mg/dL   Protein, ur NEGATIVE NEGATIVE mg/dL   Nitrite NEGATIVE NEGATIVE   Leukocytes, UA TRACE (A) NEGATIVE  Urine microscopic-add on  Status: Abnormal   Collection Time: 07/25/15  3:00 PM  Result Value Ref Range   Squamous Epithelial / LPF 0-5 (A) NONE SEEN   WBC, UA 0-5 0 - 5 WBC/hpf   RBC / HPF TOO NUMEROUS TO COUNT 0 - 5 RBC/hpf   Bacteria, UA RARE (A) NONE SEEN    Imaging:  No results found.  MAU Course: UA, pelvic exam. Fetal fibronectin collected but discarded due to normal cervical exam.  Discussed history, exam,  labs with Dr. Dareen Piano. Agrees with plan of care. Had already called prescription for in Macrobid.  MDM: - 25 year old female at 35 weeks and 6 days gestation with UTI in pregnancy. No evidence of pyelonephritis or preterm labor.  Assessment: 1. UTI in pregnancy, antepartum, second trimester     Plan: Discharge home in stable conditionPer consult with Dr. Dareen Piano.  Pyelonephritis precautions Preterm  Labor precautions and fetal kick counts Push Fluids Rx Pyridium Follow-up Information    Follow up with [facility] On 08/03/2015.   Why:  Routine prenatal visit or sooner as needed if symptoms worsen   Contact information:   [address] [place] [address] [phone]       Follow up with [facility] ADMISS[facility].   Why:  As needed if symptoms worsen   Contact information:   [address] [id number] mc [place] [address] [phone]        Medication List    TAKE these medications        nitrofurantoin (macrocrystal-monohydrate) 100 MG capsule  Commonly known as:  MACROBID  Take 1 capsule (100 mg total) by mouth 2 (two) times daily.     phenazopyridine 200 MG tablet  Commonly known as:  PYRIDIUM  Take 1 tablet (200 mg total) by mouth 3 (three) times daily as needed for pain.        [place], CNM 07/25/2015 4:52 PM

## 2015-07-27 LAB — CULTURE, OB URINE: Special Requests: NORMAL

## 2015-08-15 ENCOUNTER — Encounter (HOSPITAL_COMMUNITY): Payer: Self-pay

## 2015-08-15 ENCOUNTER — Inpatient Hospital Stay (HOSPITAL_COMMUNITY)
Admission: AD | Admit: 2015-08-15 | Discharge: 2015-08-16 | Disposition: A | Discharge status: Home / Self Care | Payer: Commercial Managed Care - PPO | Source: Ambulatory Visit | Attending: Obstetrics and Gynecology | Admitting: Obstetrics and Gynecology

## 2015-08-15 DIAGNOSIS — O2343 Unspecified infection of urinary tract in pregnancy, third trimester: Secondary | ICD-10-CM | POA: Diagnosis not present

## 2015-08-15 DIAGNOSIS — R109 Unspecified abdominal pain: Secondary | ICD-10-CM | POA: Diagnosis present

## 2015-08-15 DIAGNOSIS — Z87440 Personal history of urinary (tract) infections: Secondary | ICD-10-CM | POA: Diagnosis not present

## 2015-08-15 DIAGNOSIS — Z87891 Personal history of nicotine dependence: Secondary | ICD-10-CM | POA: Insufficient documentation

## 2015-08-15 DIAGNOSIS — Z3A29 29 weeks gestation of pregnancy: Secondary | ICD-10-CM | POA: Diagnosis not present

## 2015-08-15 DIAGNOSIS — O2342 Unspecified infection of urinary tract in pregnancy, second trimester: Secondary | ICD-10-CM

## 2015-08-15 LAB — URINE MICROSCOPIC-ADD ON

## 2015-08-15 LAB — URINALYSIS, ROUTINE W REFLEX MICROSCOPIC
Glucose, UA: NEGATIVE mg/dL
KETONES UR: 15 mg/dL — AB
NITRITE: POSITIVE — AB
PROTEIN: 100 mg/dL — AB
Specific Gravity, Urine: 1.01 (ref 1.005–1.030)
pH: 5 (ref 5.0–8.0)

## 2015-08-15 NOTE — MAU Note (Signed)
Treated about month ago for uti. Now have blood in urine and back pain on R side. Denies LOF or vag bleeding.

## 2015-08-16 DIAGNOSIS — O2343 Unspecified infection of urinary tract in pregnancy, third trimester: Secondary | ICD-10-CM | POA: Diagnosis not present

## 2015-08-16 MED ORDER — PHENAZOPYRIDINE HCL 200 MG PO TABS: 200.0000 mg | ORAL_TABLET | Freq: Three times a day (TID) | ORAL | Status: DC | PRN

## 2015-08-16 MED ORDER — SULFAMETHOXAZOLE-TRIMETHOPRIM 800-160 MG PO TABS: 1.0000 | ORAL_TABLET | Freq: Two times a day (BID) | ORAL | Status: AC

## 2015-08-16 NOTE — Discharge Instructions (Signed)
Pregnancy and Urinary Tract Infection  A urinary tract infection (UTI) is a bacterial infection of the urinary tract. Infection of the urinary tract can include the ureters, kidneys (pyelonephritis), bladder (cystitis), and urethra (urethritis). All pregnant women should be screened for bacteria in the urinary tract. Identifying and treating a UTI will decrease the risk of preterm labor and developing more serious infections in both the mother and baby.  CAUSES  Bacteria germs cause almost all UTIs.   RISK FACTORS  Many factors can increase your chances of getting a UTI during pregnancy. These include:  · Having a short urethra.  · Poor toilet and hygiene habits.  · Sexual intercourse.  · Blockage of urine along the urinary tract.  · Problems with the pelvic muscles or nerves.  · Diabetes.  · Obesity.  · Bladder problems after having several children.  · Previous history of UTI.  SIGNS AND SYMPTOMS   · Pain, burning, or a stinging feeling when urinating.  · Suddenly feeling the need to urinate right away (urgency).  · Loss of bladder control (urinary incontinence).  · Frequent urination, more than is common with pregnancy.  · Lower abdominal or back discomfort.  · Cloudy urine.  · Blood in the urine (hematuria).  · Fever.   When the kidneys are infected, the symptoms may be:  · Back pain.  · Flank pain on the right side more so than the left.  · Fever.  · Chills.  · Nausea.  · Vomiting.  DIAGNOSIS   A urinary tract infection is usually diagnosed through urine tests. Additional tests and procedures are sometimes done. These may include:  · Ultrasound exam of the kidneys, ureters, bladder, and urethra.  · Looking in the bladder with a lighted tube (cystoscopy).  TREATMENT  Typically, UTIs can be treated with antibiotic medicines.   HOME CARE INSTRUCTIONS   · Only take over-the-counter or prescription medicines as directed by your health care provider. If you were prescribed antibiotics, take them as directed. Finish  them even if you start to feel better.  · Drink enough fluids to keep your urine clear or pale yellow.  · Do not have sexual intercourse until the infection is gone and your health care provider says it is okay.  · Make sure you are tested for UTIs throughout your pregnancy. These infections often come back.   Preventing a UTI in the Future  · Practice good toilet habits. Always wipe from front to back. Use the tissue only once.  · Do not hold your urine. Empty your bladder as soon as possible when the urge comes.  · Do not douche or use deodorant sprays.  · Wash with soap and warm water around the genital area and the anus.  · Empty your bladder before and after sexual intercourse.  · Wear underwear with a cotton crotch.  · Avoid caffeine and carbonated drinks. They can irritate the bladder.  · Drink cranberry juice or take cranberry pills. This may decrease the risk of getting a UTI.  · Do not drink alcohol.  · Keep all your appointments and tests as scheduled.   SEEK MEDICAL CARE IF:   · Your symptoms get worse.  · You are still having fevers 2 or more days after treatment begins.  · You have a rash.  · You feel that you are having problems with medicines prescribed.  · You have abnormal vaginal discharge.  SEEK IMMEDIATE MEDICAL CARE IF:   · You have back or flank   pain.  · You have chills.  · You have blood in your urine.  · You have nausea and vomiting.  · You have contractions of your uterus.  · You have a gush of fluid from the vagina.  MAKE SURE YOU:  · Understand these instructions.    · Will watch your condition.    · Will get help right away if you are not doing well or get worse.       This information is not intended to replace advice given to you by your health care provider. Make sure you discuss any questions you have with your health care provider.     Document Released: 07/02/2010 Document Revised: 12/26/2012 Document Reviewed: 10/04/2012  Elsevier Interactive Patient Education ©2016 [employer]  Inc[employer]

## 2015-08-16 NOTE — MAU Provider Note (Signed)
Chief Complaint:  Recurrent UTI   First Provider Initiated Contact with Patient 08/16/15 0007      HPI: Shelley Moreno is a 25 y.o. G3P2002 at 3029w0dwho presents to maternity admissions reporting onset of suprapubic pain yesterday and right flank and lower back pain today. She was treated 1 month ago for UTI with Macrobid and feels her symptoms are similar.  She has not tried any treatments for her pain. It is gradually worsening and she is concerned that over the holiday weekend her symptoms will worsen and require treatment.  She denies cramping/contractions. She reports good fetal movement, denies LOF, vaginal bleeding, vaginal itching/burning, h/a, dizziness, n/v, or fever/chills.    HPI  Past Medical History: Past Medical History  Diagnosis Date  . Umbilical hernia     Past obstetric history: OB History  Gravida Para Term Preterm AB SAB TAB Ectopic Multiple Living  3 2 2       2     # Outcome Date GA Lbr Len/2nd Weight Sex Delivery Anes PTL Lv  3 Current           2 Term 07/19/13 4052w0d / 00:28 9 lb 7 oz (4.281 kg) M Vag-Spont EPI  Y  1 Term 04/04/10 7423w0d  8 lb 13 oz (3.997 kg) F Vag-Spont EPI  Y      Past Surgical History: History reviewed. No pertinent past surgical history.  Family History: History reviewed. No pertinent family history.  Social History: Social History  Substance Use Topics  . Smoking status: Former Games developermoker  . Smokeless tobacco: None  . Alcohol Use: No    Allergies: No Known Allergies  Meds:  Prescriptions prior to admission  Medication Sig Dispense Refill Last Dose  . nitrofurantoin, macrocrystal-monohydrate, (MACROBID) 100 MG capsule Take 1 capsule (100 mg total) by mouth 2 (two) times daily. 14 capsule 0   . [DISCONTINUED] phenazopyridine (PYRIDIUM) 200 MG tablet Take 1 tablet (200 mg total) by mouth 3 (three) times daily as needed for pain. 15 tablet 0     ROS:  Review of Systems  Constitutional: Negative for fever, chills and fatigue.   Respiratory: Negative for shortness of breath.   Cardiovascular: Negative for chest pain.  Genitourinary: Positive for dysuria, urgency and flank pain. Negative for vaginal bleeding, vaginal discharge, difficulty urinating, vaginal pain and pelvic pain.  Neurological: Negative for dizziness and headaches.  Psychiatric/Behavioral: Negative.      I have reviewed patient's Past Medical Hx, Surgical Hx, Family Hx, Social Hx, medications and allergies.   Physical Exam  Patient Vitals for the past 24 hrs:  BP Temp Pulse Resp Height Weight  08/15/15 2149 128/69 mmHg 98 F (36.7 C) 88 18 5\' 9"  (1.753 m) 229 lb 12.8 oz (104.237 kg)   Constitutional: Well-developed, well-nourished female in no acute distress.  Cardiovascular: normal rate Respiratory: normal effort GI: Abd soft, non-tender, gravid appropriate for gestational age.  MS: Extremities nontender, no edema, normal ROM Neurologic: Alert and oriented x 4.  GU: Neg CVAT.    FHT:  Baseline 120, moderate variability, accelerations present, no decelerations Contractions: None on toco or to palpation   Labs: Results for orders placed or performed during the hospital encounter of 08/15/15 (from the past 24 hour(s))  Urinalysis, Routine w reflex microscopic ([facility])     Status: Abnormal   Collection Time: 08/15/15  9:55 PM  Result Value Ref Range   Color, Urine YELLOW YELLOW   APPearance CLEAR CLEAR   Specific Gravity, Urine  1.010 1.005 - 1.030   pH 5.0 5.0 - 8.0   Glucose, UA NEGATIVE NEGATIVE mg/dL   Hgb urine dipstick LARGE (A) NEGATIVE   Bilirubin Urine SMALL (A) NEGATIVE   Ketones, ur 15 (A) NEGATIVE mg/dL   Protein, ur 161 (A) NEGATIVE mg/dL   Nitrite POSITIVE (A) NEGATIVE   Leukocytes, UA SMALL (A) NEGATIVE  Urine microscopic-add on     Status: Abnormal   Collection Time: 08/15/15  9:55 PM  Result Value Ref Range   Squamous Epithelial / LPF 0-5 (A) NONE SEEN   WBC, UA 6-30 0 - 5 WBC/hpf   RBC / HPF TOO NUMEROUS TO  COUNT 0 - 5 RBC/hpf   Bacteria, UA MANY (A) NONE SEEN      Imaging:  No results found.  MAU Course/MDM: I have ordered labs and reviewed results.  Consult Dr Claiborne Billings.  Treat UTI with Bactrim.  Urine sent for culture.  Rx renewed for pyridium.  Pt stable at time of discharge.  Assessment: 1. UTI in pregnancy, antepartum, third trimester   2. UTI in pregnancy, antepartum, second trimester     Plan: Discharge home Preterm labor precautions and fetal kick counts    Medication List    STOP taking these medications        nitrofurantoin (macrocrystal-monohydrate) 100 MG capsule  Commonly known as:  MACROBID      TAKE these medications        phenazopyridine 200 MG tablet  Commonly known as:  PYRIDIUM  Take 1 tablet (200 mg total) by mouth 3 (three) times daily as needed for pain.     sulfamethoxazole-trimethoprim 800-160 MG tablet  Commonly known as:  BACTRIM DS,SEPTRA DS  Take 1 tablet by mouth 2 (two) times daily.       Follow-up Information    Follow up with CALLAHAN, SIDNEY, DO.   Specialty:  Obstetrics and Gynecology   Why:  As scheduled, Return to MAU as needed for emergencies   Contact information:   [address] [place] [address] [phone]        Medication List    STOP taking these medications        nitrofurantoin (macrocrystal-monohydrate) 100 MG capsule  Commonly known as:  MACROBID      TAKE these medications        phenazopyridine 200 MG tablet  Commonly known as:  PYRIDIUM  Take 1 tablet (200 mg total) by mouth 3 (three) times daily as needed for pain.     sulfamethoxazole-trimethoprim 800-160 MG tablet  Commonly known as:  BACTRIM DS,SEPTRA DS  Take 1 tablet by mouth 2 (two) times daily.        Sharen Counter Certified Nurse-Midwife 08/16/2015 12:14 AM

## 2015-08-17 LAB — CULTURE, OB URINE

## 2015-09-30 LAB — OB RESULTS CONSOLE GBS: GBS: NEGATIVE

## 2015-10-24 ENCOUNTER — Encounter (HOSPITAL_COMMUNITY): Payer: Self-pay | Admitting: *Deleted

## 2015-10-24 ENCOUNTER — Inpatient Hospital Stay (HOSPITAL_COMMUNITY): Payer: Commercial Managed Care - PPO | Admitting: Anesthesiology

## 2015-10-24 ENCOUNTER — Inpatient Hospital Stay (HOSPITAL_COMMUNITY)
Admission: AD | Admit: 2015-10-24 | Discharge: 2015-10-25 | DRG: 775 | Disposition: A | Discharge status: Home / Self Care | Payer: Commercial Managed Care - PPO | Source: Ambulatory Visit | Attending: Obstetrics | Admitting: Obstetrics

## 2015-10-24 DIAGNOSIS — K219 Gastro-esophageal reflux disease without esophagitis: Secondary | ICD-10-CM | POA: Diagnosis present

## 2015-10-24 DIAGNOSIS — O99214 Obesity complicating childbirth: Secondary | ICD-10-CM | POA: Diagnosis present

## 2015-10-24 DIAGNOSIS — O4202 Full-term premature rupture of membranes, onset of labor within 24 hours of rupture: Principal | ICD-10-CM | POA: Diagnosis present

## 2015-10-24 DIAGNOSIS — Z6835 Body mass index (BMI) 35.0-35.9, adult: Secondary | ICD-10-CM | POA: Diagnosis not present

## 2015-10-24 DIAGNOSIS — Z3A38 38 weeks gestation of pregnancy: Secondary | ICD-10-CM

## 2015-10-24 DIAGNOSIS — O9962 Diseases of the digestive system complicating childbirth: Secondary | ICD-10-CM | POA: Diagnosis present

## 2015-10-24 DIAGNOSIS — Z87891 Personal history of nicotine dependence: Secondary | ICD-10-CM

## 2015-10-24 DIAGNOSIS — E669 Obesity, unspecified: Secondary | ICD-10-CM | POA: Diagnosis present

## 2015-10-24 LAB — POCT FERN TEST: POCT Fern Test: POSITIVE

## 2015-10-24 LAB — CBC
HEMATOCRIT: 31.5 % — AB (ref 36.0–46.0)
Hemoglobin: 10.8 g/dL — ABNORMAL LOW (ref 12.0–15.0)
MCH: 29.6 pg (ref 26.0–34.0)
MCHC: 34.3 g/dL (ref 30.0–36.0)
MCV: 86.3 fL (ref 78.0–100.0)
Platelets: 184 10*3/uL (ref 150–400)
RBC: 3.65 MIL/uL — ABNORMAL LOW (ref 3.87–5.11)
RDW: 13.2 % (ref 11.5–15.5)
WBC: 16 10*3/uL — AB (ref 4.0–10.5)

## 2015-10-24 LAB — TYPE AND SCREEN
ABO/RH(D): O POS
ANTIBODY SCREEN: NEGATIVE

## 2015-10-24 LAB — RPR: RPR Ser Ql: NONREACTIVE

## 2015-10-24 MED ORDER — SOD CITRATE-CITRIC ACID 500-334 MG/5ML PO SOLN: 30.0000 mL | ORAL | Status: DC | PRN

## 2015-10-24 MED ORDER — ACETAMINOPHEN 325 MG PO TABS: 650.0000 mg | ORAL_TABLET | ORAL | Status: DC | PRN

## 2015-10-24 MED ORDER — ZOLPIDEM TARTRATE 5 MG PO TABS: 5.0000 mg | ORAL_TABLET | Freq: Every evening | ORAL | Status: DC | PRN

## 2015-10-24 MED ORDER — OXYCODONE HCL 5 MG PO TABS: 5.0000 mg | ORAL_TABLET | ORAL | Status: DC | PRN

## 2015-10-24 MED ORDER — OXYCODONE HCL 5 MG PO TABS: 10.0000 mg | ORAL_TABLET | ORAL | Status: DC | PRN

## 2015-10-24 MED ORDER — BENZOCAINE-MENTHOL 20-0.5 % EX AERO: 1.0000 "application " | INHALATION_SPRAY | CUTANEOUS | Status: DC | PRN

## 2015-10-24 MED ORDER — ONDANSETRON HCL 4 MG/2ML IJ SOLN: 4.0000 mg | Freq: Four times a day (QID) | INTRAMUSCULAR | Status: DC | PRN

## 2015-10-24 MED ORDER — TERBUTALINE SULFATE 1 MG/ML IJ SOLN
0.2500 mg | Freq: Once | INTRAMUSCULAR | Status: DC | PRN
  Filled 2015-10-24: qty 1

## 2015-10-24 MED ORDER — LACTATED RINGERS IV SOLN: 500.0000 mL | Freq: Once | INTRAVENOUS | Status: DC

## 2015-10-24 MED ORDER — EPHEDRINE 5 MG/ML INJ
10.0000 mg | INTRAVENOUS | Status: DC | PRN
  Filled 2015-10-24: qty 4

## 2015-10-24 MED ORDER — DIPHENHYDRAMINE HCL 50 MG/ML IJ SOLN: 12.5000 mg | INTRAMUSCULAR | Status: DC | PRN

## 2015-10-24 MED ORDER — LACTATED RINGERS IV SOLN
INTRAVENOUS | Status: DC
  Administered 2015-10-24: 07:00:00 via INTRAVENOUS

## 2015-10-24 MED ORDER — LIDOCAINE HCL (PF) 1 % IJ SOLN
INTRAMUSCULAR | Status: DC | PRN
  Administered 2015-10-24 (×2): 5 mL via EPIDURAL

## 2015-10-24 MED ORDER — TETANUS-DIPHTH-ACELL PERTUSSIS 5-2.5-18.5 LF-MCG/0.5 IM SUSP: 0.5000 mL | Freq: Once | INTRAMUSCULAR | Status: DC

## 2015-10-24 MED ORDER — OXYTOCIN BOLUS FROM INFUSION
500.0000 mL | Freq: Once | INTRAVENOUS | Status: AC
  Administered 2015-10-24: 500 mL/h via INTRAVENOUS

## 2015-10-24 MED ORDER — DIBUCAINE 1 % RE OINT: 1.0000 "application " | TOPICAL_OINTMENT | RECTAL | Status: DC | PRN

## 2015-10-24 MED ORDER — ONDANSETRON HCL 4 MG PO TABS: 4.0000 mg | ORAL_TABLET | ORAL | Status: DC | PRN

## 2015-10-24 MED ORDER — LIDOCAINE HCL (PF) 1 % IJ SOLN
30.0000 mL | INTRAMUSCULAR | Status: DC | PRN
  Filled 2015-10-24: qty 30

## 2015-10-24 MED ORDER — EPHEDRINE 5 MG/ML INJ
10.0000 mg | INTRAVENOUS | Status: DC | PRN
Start: 2015-10-24 — End: 2015-10-24
  Filled 2015-10-24: qty 4

## 2015-10-24 MED ORDER — ACETAMINOPHEN 325 MG PO TABS
650.0000 mg | ORAL_TABLET | ORAL | Status: DC | PRN
  Administered 2015-10-24: 650 mg via ORAL
  Filled 2015-10-24: qty 2

## 2015-10-24 MED ORDER — OXYTOCIN 40 UNITS IN LACTATED RINGERS INFUSION - SIMPLE MED: 2.5000 [IU]/h | INTRAVENOUS | Status: DC

## 2015-10-24 MED ORDER — COCONUT OIL OIL: 1.0000 "application " | TOPICAL_OIL | Status: DC | PRN

## 2015-10-24 MED ORDER — FENTANYL 2.5 MCG/ML BUPIVACAINE 1/10 % EPIDURAL INFUSION (WH - ANES): 14.0000 mL/h | INTRAMUSCULAR | Status: DC | PRN

## 2015-10-24 MED ORDER — PHENYLEPHRINE 40 MCG/ML (10ML) SYRINGE FOR IV PUSH (FOR BLOOD PRESSURE SUPPORT)
80.0000 ug | PREFILLED_SYRINGE | INTRAVENOUS | Status: DC | PRN
  Filled 2015-10-24: qty 5

## 2015-10-24 MED ORDER — PRENATAL MULTIVITAMIN CH
1.0000 | ORAL_TABLET | Freq: Every day | ORAL | Status: DC
  Administered 2015-10-25: 1 via ORAL
  Filled 2015-10-24: qty 1

## 2015-10-24 MED ORDER — BUTORPHANOL TARTRATE 1 MG/ML IJ SOLN: 1.0000 mg | INTRAMUSCULAR | Status: DC | PRN

## 2015-10-24 MED ORDER — PHENYLEPHRINE 40 MCG/ML (10ML) SYRINGE FOR IV PUSH (FOR BLOOD PRESSURE SUPPORT)
80.0000 ug | PREFILLED_SYRINGE | INTRAVENOUS | Status: DC | PRN
  Filled 2015-10-24: qty 5
  Filled 2015-10-24: qty 10

## 2015-10-24 MED ORDER — SIMETHICONE 80 MG PO CHEW: 80.0000 mg | CHEWABLE_TABLET | ORAL | Status: DC | PRN

## 2015-10-24 MED ORDER — FENTANYL 2.5 MCG/ML BUPIVACAINE 1/10 % EPIDURAL INFUSION (WH - ANES)
14.0000 mL/h | INTRAMUSCULAR | Status: DC | PRN
  Administered 2015-10-24: 14 mL/h via EPIDURAL
  Filled 2015-10-24: qty 125

## 2015-10-24 MED ORDER — FLEET ENEMA 7-19 GM/118ML RE ENEM: 1.0000 | ENEMA | RECTAL | Status: DC | PRN

## 2015-10-24 MED ORDER — IBUPROFEN 600 MG PO TABS
600.0000 mg | ORAL_TABLET | Freq: Four times a day (QID) | ORAL | Status: DC
  Administered 2015-10-24 – 2015-10-25 (×4): 600 mg via ORAL
  Filled 2015-10-24 (×4): qty 1

## 2015-10-24 MED ORDER — WITCH HAZEL-GLYCERIN EX PADS
1.0000 "application " | MEDICATED_PAD | CUTANEOUS | Status: DC | PRN
  Administered 2015-10-24: 1 via TOPICAL

## 2015-10-24 MED ORDER — OXYCODONE-ACETAMINOPHEN 5-325 MG PO TABS: 2.0000 | ORAL_TABLET | ORAL | Status: DC | PRN

## 2015-10-24 MED ORDER — OXYCODONE-ACETAMINOPHEN 5-325 MG PO TABS
1.0000 | ORAL_TABLET | ORAL | Status: DC | PRN
Start: 2015-10-24 — End: 2015-10-24

## 2015-10-24 MED ORDER — DIPHENHYDRAMINE HCL 25 MG PO CAPS: 25.0000 mg | ORAL_CAPSULE | Freq: Four times a day (QID) | ORAL | Status: DC | PRN

## 2015-10-24 MED ORDER — SENNOSIDES-DOCUSATE SODIUM 8.6-50 MG PO TABS
2.0000 | ORAL_TABLET | ORAL | Status: DC
  Administered 2015-10-24: 2 via ORAL
  Filled 2015-10-24: qty 2

## 2015-10-24 MED ORDER — OXYTOCIN 40 UNITS IN LACTATED RINGERS INFUSION - SIMPLE MED
1.0000 m[IU]/min | INTRAVENOUS | Status: DC
  Administered 2015-10-24: 2 m[IU]/min via INTRAVENOUS
  Filled 2015-10-24: qty 1000

## 2015-10-24 MED ORDER — LACTATED RINGERS IV SOLN: 500.0000 mL | INTRAVENOUS | Status: DC | PRN

## 2015-10-24 MED ORDER — ONDANSETRON HCL 4 MG/2ML IJ SOLN: 4.0000 mg | INTRAMUSCULAR | Status: DC | PRN

## 2015-10-24 NOTE — Lactation Note (Signed)
This note was copied from a baby's chart. Lactation Consultation Note Initial visit at 7 hours of age.  Mom reports a good feeding after delivery and baby has been sleepy.  Mom has experience with older 2 children breastfeeding and denies concerns at this time.  Mom has large everted nipples and is able to easily express colsstrum.  [facility] LC resources given and discussed.  Encouraged to feed with early cues on demand.  Early newborn behavior discussed.  Mom to call for assist as needed.    Patient Name: Shelley Moreno OMBTD'H Date: 10/24/2015 Reason for consult: Initial assessment   Maternal Data Has patient been taught Hand Expression?: Yes Does the patient have breastfeeding experience prior to this delivery?: Yes  Feeding    LATCH Score/Interventions                Intervention(s): Breastfeeding basics reviewed     Lactation Tools Discussed/Used WIC Program: Yes   Consult Status Consult Status: Follow-up Date: 10/25/15 Follow-up type: In-patient    Shoptaw, Arvella Merles 10/24/2015, 7:14 PM

## 2015-10-24 NOTE — MAU Note (Signed)
Pt reports leaking fluid since 4:30. Denies pain.

## 2015-10-24 NOTE — Anesthesia Pain Management Evaluation Note (Signed)
  CRNA Pain Management Visit Note  Patient: Shelley Moreno, 25 y.o., female  "Hello I am a member of the anesthesia team at [facility]. We have an anesthesia team available at all times to provide care throughout the hospital, including epidural management and anesthesia for C-section. I don't know your plan for the delivery whether it a natural birth, water birth, IV sedation, nitrous supplementation, doula or epidural, but we want to meet your pain goals."   1.Was your pain managed to your expectations on prior hospitalizations?   Yes   2.What is your expectation for pain management during this hospitalization?     Epidural  3.How can we help you reach that goal? Epidural just placed before interviewing patient  Record the patient's initial score and the patient's pain goal.   Pain: 6  Pain Goal: 3 The [facility] wants you to be able to say your pain was always managed very well.  Rica Records 10/24/2015

## 2015-10-24 NOTE — Anesthesia Preprocedure Evaluation (Signed)
Anesthesia Evaluation  Patient identified by MRN, date of birth, ID band Patient awake    Reviewed: Allergy & Precautions, H&P , Patient's Chart, lab work & pertinent test results  Airway Mallampati: II  TM Distance: >3 FB Neck ROM: Full    Dental no notable dental hx. (+) Poor Dentition, Dental Advisory Given   Pulmonary former smoker,    Pulmonary exam normal breath sounds clear to auscultation       Cardiovascular negative cardio ROS Normal cardiovascular exam Rhythm:Regular Rate:Normal     Neuro/Psych negative neurological ROS  negative psych ROS   GI/Hepatic Neg liver ROS, GERD  Medicated and Controlled,  Endo/Other  Obesity  Renal/GU negative Renal ROS  negative genitourinary   Musculoskeletal negative musculoskeletal ROS (+)   Abdominal   Peds  Hematology negative hematology ROS (+)   Anesthesia Other Findings   Reproductive/Obstetrics (+) Pregnancy                             Anesthesia Physical  Anesthesia Plan  ASA: II  Anesthesia Plan: Epidural   Post-op Pain Management:    Induction:   Airway Management Planned: Natural Airway  Additional Equipment:   Intra-op Plan:   Post-operative Plan:   Informed Consent: I have reviewed the patients History and Physical, chart, labs and discussed the procedure including the risks, benefits and alternatives for the proposed anesthesia with the patient or authorized representative who has indicated his/her understanding and acceptance.     Plan Discussed with: Anesthesiologist  Anesthesia Plan Comments:         Anesthesia Quick Evaluation

## 2015-10-24 NOTE — Anesthesia Procedure Notes (Signed)
Epidural Patient location during procedure: OB Start time: 10/24/2015 9:58 AM End time: 10/24/2015 10:08 AM  Staffing Anesthesiologist: Ronelle Nigh Performed: anesthesiologist   Preanesthetic Checklist Completed: patient identified, site marked, surgical consent, pre-op evaluation, timeout performed, IV checked, risks and benefits discussed and monitors and equipment checked  Epidural Patient position: sitting Prep: site prepped and draped and DuraPrep Patient monitoring: continuous pulse ox and blood pressure Approach: midline Location: L3-L4 Injection technique: LOR air  Needle:  Needle type: Tuohy  Needle gauge: 17 G Needle length: 9 cm and 9 Needle insertion depth: 5 cm cm Catheter type: closed end flexible Catheter size: 19 Gauge Catheter at skin depth: 10 cm Test dose: negative  Assessment Sensory level: T10 Events: blood not aspirated, injection not painful, no injection resistance, negative IV test and no paresthesia  Additional Notes Patient identified. Risks/Benefits/Options discussed with patient including but not limited to bleeding, infection, nerve damage, paralysis, failed block, incomplete pain control, headache, blood pressure changes, nausea, vomiting, reactions to medication both or allergic, itching and postpartum back pain. Confirmed with bedside nurse the patient's most recent platelet count. Confirmed with patient that they are not currently taking any anticoagulation, have any bleeding history or any family history of bleeding disorders. Patient expressed understanding and wished to proceed. All questions were answered. Sterile technique was used throughout the entire procedure. Please see nursing notes for vital signs. Test dose was given through epidural catheter and negative prior to continuing to dose epidural or start infusion. Warning signs of high block given to the patient including shortness of breath, tingling/numbness in hands, complete motor  block, or any concerning symptoms with instructions to call for help. Patient was given instructions on fall risk and not to get out of bed. All questions and concerns addressed with instructions to call with any issues or inadequate analgesia.

## 2015-10-24 NOTE — H&P (Signed)
25 y.o. [id number] @ [redacted]w[redacted]d presents with PROM at 0430.  Ruled in with positive fern.  Otherwise has good fetal movement and no bleeding.  Pregnancy uncomplicated  Past Medical History:  Diagnosis Date  . Umbilical hernia    History reviewed. No pertinent surgical history.  OB History  Gravida Para Term Preterm AB Living  3 2 2     2   SAB TAB Ectopic Multiple Live Births          2    # Outcome Date GA Lbr Len/2nd Weight Sex Delivery Anes PTL Lv  3 Current           2 Term 07/19/13 [redacted]w[redacted]d / 00:28 4.281 kg (9 lb 7 oz) M Vag-Spont EPI  LIV  1 Term 04/04/10 [redacted]w[redacted]d  3.997 kg (8 lb 13 oz) F Vag-Spont EPI  LIV      Social History   Social History  . Marital status: Single    Spouse name: N/A  . Number of children: N/A  . Years of education: N/A   Occupational History  . Not on file.   Social History Main Topics  . Smoking status: Former Games developer  . Smokeless tobacco: Never Used  . Alcohol use No  . Drug use: No  . Sexual activity: Yes   Other Topics Concern  . Not on file   Social History Narrative  . No narrative on file   Review of patient's allergies indicates no known allergies.    Prenatal Transfer Tool  Maternal Diabetes: No Genetic Screening: Declined Maternal Ultrasounds/Referrals: Normal Fetal Ultrasounds or other Referrals:  None Maternal Substance Abuse:  No Significant Maternal Medications:  None Significant Maternal Lab Results: Lab values include: Group B Strep negative  ABO, Rh: O/Positive/-- (01/11 0000) Antibody: Negative (01/11 0000) Rubella:Immune RPR: Nonreactive (01/11 0000)  HBsAg: Negative (01/11 0000)  HIV: Non-reactive (01/11 0000)  GBS: Negative (07/12 0000)        Vitals:   10/24/15 0730 10/24/15 0743  BP:  118/70  Pulse:  82  Resp:    Temp: 98.2 F (36.8 C)      General:  NAD Abdomen:  soft, gravid, EFW 8-8.5# Ex:  1+ edema SVE:  4/60/-1 per RN FHTs:  130s, mod var, + accels, no decels Toco:  irregular   A/P   25 y.o.  [id number] [redacted]w[redacted]d presents with PROM IOL with pitocin Epidural upon maternal request FSR/ vtx/ GBS neg  [facility] [name] Madysun Thall

## 2015-10-25 ENCOUNTER — Encounter (HOSPITAL_COMMUNITY): Payer: Self-pay | Admitting: General Practice

## 2015-10-25 LAB — CBC
HEMATOCRIT: 31.7 % — AB (ref 36.0–46.0)
Hemoglobin: 10.6 g/dL — ABNORMAL LOW (ref 12.0–15.0)
MCH: 29.3 pg (ref 26.0–34.0)
MCHC: 33.4 g/dL (ref 30.0–36.0)
MCV: 87.6 fL (ref 78.0–100.0)
Platelets: 191 10*3/uL (ref 150–400)
RBC: 3.62 MIL/uL — ABNORMAL LOW (ref 3.87–5.11)
RDW: 13.3 % (ref 11.5–15.5)
WBC: 14.7 10*3/uL — ABNORMAL HIGH (ref 4.0–10.5)

## 2015-10-25 MED ORDER — IBUPROFEN 600 MG PO TABS: 600.0000 mg | ORAL_TABLET | Freq: Four times a day (QID) | ORAL | 0 refills | Status: DC

## 2015-10-25 NOTE — Anesthesia Postprocedure Evaluation (Signed)
Anesthesia Post Note  Patient: Shelley Moreno  Procedure(s) Performed: * No procedures listed *  Patient location during evaluation: Mother Baby Anesthesia Type: Epidural Level of consciousness: awake and alert Pain management: pain level controlled Vital Signs Assessment: post-procedure vital signs reviewed and stable Respiratory status: spontaneous breathing, nonlabored ventilation and respiratory function stable Cardiovascular status: stable Postop Assessment: no headache, no backache, epidural receding and patient able to bend at knees Anesthetic complications: no     Last Vitals:  Vitals:   10/24/15 1930 10/25/15 0617  BP: (!) 111/57 (!) 115/58  Pulse: 79 71  Resp: 18 18  Temp: 36.8 C 36.7 C    Last Pain:  Vitals:   10/25/15 0848  TempSrc:   PainSc: 1    Pain Goal: Patients Stated Pain Goal: 3 (10/24/15 1050)               Rica RecordsICKELTON,Daris Aristizabal

## 2015-10-25 NOTE — Progress Notes (Signed)
PPD#1 Pt without complaints. Would like to go home. Lochia wnl VSSAF IMP/Doing well Plan/ Will discharge to home.

## 2015-10-25 NOTE — Discharge Summary (Signed)
Obstetric Discharge Summary Reason for Admission: rupture of membranes Prenatal Procedures: ultrasound Intrapartum Procedures: spontaneous vaginal delivery Postpartum Procedures: none Complications-Operative and Postpartum: none Hemoglobin  Date Value Ref Range Status  10/25/2015 10.6 (L) 12.0 - 15.0 g/dL Final   HCT  Date Value Ref Range Status  10/25/2015 31.7 (L) 36.0 - 46.0 % Final    Physical Exam:  General: alert Lochia: appropriate Uterine Fundus: firm   Discharge Diagnoses: Term Pregnancy-delivered  Discharge Information: Date: 10/25/2015 Activity: pelvic rest Diet: routine Medications: PNV and Ibuprofen Condition: stable Instructions: refer to practice specific booklet Discharge to: home Follow-up Information    [name]. Schedule an appointment as soon as possible for a visit in 1 month(s).   Specialty:  Obstetrics Contact information: [address] [place] [address] [phone]           Newborn Data: Live born female  Birth Weight: 8 lb 3.6 oz (3731 g) APGAR: 9, 10  Home with mother.  ANDERSON,MARK E 10/25/2015, 9:54 AM

## 2015-10-25 NOTE — Lactation Note (Signed)
This note was copied from a baby's chart. Lactation Consultation Note  Follow up consult with his mom and term baby,. Mom is experienced, has easily expressed colostrum, and denies any questions/concerns. Mom knows to call for questions/concerns.  Patient Name: Girl Corliss ParishLaura Bowlby ZOXWR'UToday's Date: 10/25/2015 Reason for consult: Follow-up assessment   Maternal Data    Feeding Feeding Type: Breast Fed Length of feed: 10 min  LATCH Score/Interventions Latch: Grasps breast easily, tongue down, lips flanged, rhythmical sucking.  Audible Swallowing: Spontaneous and intermittent  Type of Nipple: Everted at rest and after stimulation  Comfort (Breast/Nipple): Soft / non-tender     Hold (Positioning): No assistance needed to correctly position infant at breast.  LATCH Score: 10  Lactation Tools Discussed/Used     Consult Status Consult Status: Complete Follow-up type: Call as needed    Alfred LevinsLee, Davonne Baby Anne 10/25/2015, 11:36 AM

## 2016-08-07 ENCOUNTER — Emergency Department (HOSPITAL_COMMUNITY): Payer: Commercial Managed Care - PPO

## 2016-08-07 ENCOUNTER — Emergency Department (HOSPITAL_COMMUNITY)
Admission: EM | Admit: 2016-08-07 | Discharge: 2016-08-07 | Disposition: A | Discharge status: Home / Self Care | Payer: Commercial Managed Care - PPO | Attending: Emergency Medicine | Admitting: Emergency Medicine

## 2016-08-07 ENCOUNTER — Encounter (HOSPITAL_COMMUNITY): Payer: Self-pay | Admitting: Emergency Medicine

## 2016-08-07 DIAGNOSIS — Y9289 Other specified places as the place of occurrence of the external cause: Secondary | ICD-10-CM | POA: Insufficient documentation

## 2016-08-07 DIAGNOSIS — W228XXA Striking against or struck by other objects, initial encounter: Secondary | ICD-10-CM | POA: Diagnosis not present

## 2016-08-07 DIAGNOSIS — Y999 Unspecified external cause status: Secondary | ICD-10-CM | POA: Diagnosis not present

## 2016-08-07 DIAGNOSIS — S91115A Laceration without foreign body of left lesser toe(s) without damage to nail, initial encounter: Secondary | ICD-10-CM

## 2016-08-07 DIAGNOSIS — Z23 Encounter for immunization: Secondary | ICD-10-CM | POA: Insufficient documentation

## 2016-08-07 DIAGNOSIS — Y9389 Activity, other specified: Secondary | ICD-10-CM | POA: Diagnosis not present

## 2016-08-07 MED ORDER — TETANUS-DIPHTH-ACELL PERTUSSIS 5-2.5-18.5 LF-MCG/0.5 IM SUSP
0.5000 mL | Freq: Once | INTRAMUSCULAR | Status: AC
  Administered 2016-08-07: 0.5 mL via INTRAMUSCULAR
  Filled 2016-08-07: qty 0.5

## 2016-08-07 NOTE — Discharge Instructions (Signed)
As discussed, Keep the area clean and dry for the next 24 hours. Monitor for any signs of infection including redness, swelling, pain, warmth to touch. Follow up with her primary care provider as needed.  Be seen by provider immediately if acutely experiencing any signs of infection.

## 2016-08-07 NOTE — ED Notes (Signed)
Pt brought back to C29 pt refused a wheelchair.

## 2016-08-07 NOTE — ED Notes (Signed)
Laceration at base of left 4th toe on vent grate last night around 10 pm. No bleeding at this time.

## 2016-08-07 NOTE — ED Provider Notes (Signed)
[facility] Provider Note   CSN: [id number] Arrival date & time: 08/07/16  1039  By signing my name below, I, Rosana Fret, attest that this documentation has been prepared under the direction and in the presence of non-physician practitioner, Shanda Bumps B. Clovis Riley, PA-C. Electronically Signed: Rosana Fret, ED Scribe. 08/07/16. 2:44 PM.   History   Chief Complaint Chief Complaint  Patient presents with  . Foot Pain  . Extremity Laceration   The history is provided by the patient. No language interpreter was used.   HPI Comments: Shelley Moreno is a 26 y.o. female who presents to the Emergency Department complaining of a sudden onset, moderate cut on the bottom of her 3rd toe on her left foot onset last night. She states she cut her toe on a vent in her house and Tetanus was updated in the ED. Pt describes her associated pain as a stinging sensation and states that it is exacerbated by movement. Pt reports associated edema of the foot. Per pt, she has been soaking her foot in the ED but no prior treatments were tried. Pt denies numbness, tingling or any other complaints at this time.  Past Medical History:  Diagnosis Date  . Umbilical hernia     Patient Active Problem List   Diagnosis Date Noted  . Labor abnormal 10/24/2015  . Active labor 07/19/2013  . Supervision of other normal pregnancy 07/19/2013    History reviewed. No pertinent surgical history.  OB History    Gravida Para Term Preterm AB Living   3 3 3     3    SAB TAB Ectopic Multiple Live Births         0 3       Home Medications    Prior to Admission medications   Medication Sig Start Date End Date Taking? Authorizing Provider  ibuprofen (ADVIL,MOTRIN) 600 MG tablet Take 1 tablet (600 mg total) by mouth every 6 (six) hours. 10/25/15   Levi Aland, MD    Family History No family history on file.  Social History Social History  Substance Use Topics  . Smoking status: Never Smoker  .  Smokeless tobacco: Never Used  . Alcohol use No     Allergies   Patient has no known allergies.   Review of Systems Review of Systems  Constitutional: Negative for chills and fever.  HENT: Negative for ear pain and sore throat.   Eyes: Negative for pain and visual disturbance.  Respiratory: Negative for cough and shortness of breath.   Cardiovascular: Negative for chest pain and palpitations.  Gastrointestinal: Negative for abdominal pain and vomiting.  Genitourinary: Negative for dysuria and hematuria.  Musculoskeletal: Negative for arthralgias and back pain.  Skin: Positive for wound. Negative for color change and rash.  Neurological: Negative for seizures, syncope and numbness.     Physical Exam Updated Vital Signs BP 119/73 (BP Location: Right Arm)   Pulse 82   Temp 99.1 F (37.3 C) (Oral)   Resp 19   Ht 5\' 8"  (1.727 m)   Wt 220 lb (99.8 kg)   SpO2 98%   BMI 33.45 kg/m   Physical Exam  Constitutional: She is oriented to person, place, and time. She appears well-developed and well-nourished. No distress.  Patient is afebrile, non-toxic appearing, seating comfortably in chair in no acute distress.   HENT:  Head: Normocephalic and atraumatic.  Eyes: Pupils are equal, round, and reactive to light.  Neck: Neck supple.  Cardiovascular: Normal rate, regular rhythm,  normal heart sounds and intact distal pulses.  Exam reveals no gallop and no friction rub.   No murmur heard. Pulmonary/Chest: Effort normal and breath sounds normal. No respiratory distress. She has no wheezes. She has no rales.  Musculoskeletal: Normal range of motion. She exhibits no edema, tenderness or deformity.  Neurological: She is alert and oriented to person, place, and time. No sensory deficit.  NVI distally. Full rom 5/5 strength to resisted flexion and extension of 3rd toe, brisk cap refills  Skin: Skin is warm and dry. Capillary refill takes less than 2 seconds. Laceration (7 mm laceration to  the 3rd toe of the left foot. ) noted. No rash noted. She is not diaphoretic. No erythema. No pallor.  Psychiatric: She has a normal mood and affect.  Nursing note and vitals reviewed.    ED Treatments / Results  DIAGNOSTIC STUDIES: Oxygen Saturation is 98% on RA, normal by my interpretation.   COORDINATION OF CARE: 12:22 PM-Discussed next steps with pt including XR. Pt verbalized understanding and is agreeable with the plan. Pt denied any medication for pain.    Labs (all labs ordered are listed, but only abnormal results are displayed) Labs Reviewed - No data to display  EKG  EKG Interpretation None       Radiology Dg Foot Complete Left  Result Date: 08/07/2016 CLINICAL DATA:  Pt states she "hit the bottom of her left foot on an air vent grate" last night around 10 pm. No bleeding noted at this time. Laceration at base of left 4th toe. Pt reports a throbbing pain at her 4th toe is currently at a 4 out of 10. EXAM: LEFT FOOT - COMPLETE 3+ VIEW COMPARISON:  None. FINDINGS: There is no evidence of fracture or dislocation. There is no evidence of arthropathy or other focal bone abnormality. Soft tissues are unremarkable. IMPRESSION: Negative. Electronically Signed   By: Amie Portlandavid  Ormond M.D.   On: 08/07/2016 13:06    Procedures Procedures (including critical care time) LACERATION REPAIR Performed by: Georgiana ShoreJessica B Rayonna Heldman Authorized by: Georgiana ShoreJessica B [place] Throckmorton Consent: Verbal consent obtained. Risks and benefits: risks, benefits and alternatives were discussed Consent given by: patient Patient identity confirmed: provided demographic data Prepped and Draped in normal sterile fashion Wound explored  Laceration Location: left small toe   Laceration Length: 0.7cm  No Foreign Bodies seen or palpated  Anesthetic total: n/a  Irrigation method: syringe Amount of cleaning: standard  Skin closure: dermabond  Number of sutures: none  Technique: dermabond  Patient tolerance:  Patient tolerated the procedure well with no immediate complications.  Medications Ordered in ED Medications  Tdap (BOOSTRIX) injection 0.5 mL (0.5 mLs Intramuscular Given 08/07/16 1121)     Initial Impression / Assessment and Plan / ED Course  I have reviewed the triage vital signs and the nursing notes.  Pertinent labs & imaging results that were available during my care of the patient were reviewed by me and considered in my medical decision making (see chart for details).     Very superficial laceration of palmar aspect of left 3rd toe at the crease of PIP. Imaging negative for dislocation or fracture. Irrigated and closed with dermabond. Buddy taped splinting method to prevent excessive movement. NVI and 5/5 strength to resisted flexion and extension of toe.  Tetanus updated in ED. Laceration occurred < 12 hours prior to repair. Discussed laceration care with pt and answered questions. Pt to f-u as needed for wound check or sooner should there be signs of  infection. Pt is hemodynamically stable with no complaints prior to dc.    Discussed strict return precautions and advised to return to the emergency department if experiencing any new or worsening symptoms. Instructions were understood and patient agreed with discharge plan.   Final Clinical Impressions(s) / ED Diagnoses   Final diagnoses:  Laceration of lesser toe of left foot without foreign body present or damage to nail, initial encounter    New Prescriptions Discharge Medication List as of 08/07/2016  1:33 PM     I personally performed the services described in this documentation, which was scribed in my presence. The recorded information has been reviewed and is accurate.     Georgiana Shore, PA-C 08/07/16 1448    Benjiman Core, MD 08/07/16 1843

## 2016-08-07 NOTE — ED Notes (Signed)
Pt going to xray  

## 2016-08-07 NOTE — ED Triage Notes (Signed)
Pt. Stated, I cut he bottom of my foot, step on a vent in the house last night. . Cut is under the second and third toe.

## 2016-08-07 NOTE — ED Notes (Signed)
Patient transported to X-ray 

## 2017-09-20 ENCOUNTER — Ambulatory Visit (HOSPITAL_COMMUNITY)
Admission: EM | Admit: 2017-09-20 | Discharge: 2017-09-20 | Disposition: A | Discharge status: Home / Self Care | Payer: Commercial Managed Care - PPO | Attending: Family Medicine | Admitting: Family Medicine

## 2017-09-20 ENCOUNTER — Encounter (HOSPITAL_COMMUNITY): Payer: Self-pay | Admitting: *Deleted

## 2017-09-20 DIAGNOSIS — R221 Localized swelling, mass and lump, neck: Secondary | ICD-10-CM | POA: Insufficient documentation

## 2017-09-20 LAB — POCT RAPID STREP A: Streptococcus, Group A Screen (Direct): NEGATIVE

## 2017-09-20 MED ORDER — PREDNISONE 10 MG PO TABS: 20.0000 mg | ORAL_TABLET | Freq: Two times a day (BID) | ORAL | 0 refills | Status: AC

## 2017-09-20 NOTE — Discharge Instructions (Addendum)
Strep test negative, will send out for culture and we will call you with results Get plenty of rest and push fluids Follow up with PCP regarding discontinuing your lexapro.  This may be triggering your symptoms Prednisone prescribed.  Take as directed and to completion Return or go to ER if patient has any new or worsening symptoms

## 2017-09-20 NOTE — ED Triage Notes (Signed)
Patient reports swelling to throat after taking medication, states that she was started on Lexapro 30 days ago, and this has happened 3 times since taking it. States the swelling will last for about 3 days. Patient states this is the only thing that has changed but states that NP that started her on medication stated that it is not likely from the lexapro. Patient denies any fever or scratchy throat associated with feeling. States throat hurts when she presses on sides. Airway intact.

## 2017-09-20 NOTE — ED Provider Notes (Signed)
[facility]   [id number] 09/20/17 Arrival Time: 1347  SUBJECTIVE: History from: patient.  Alvera NovelLaura M Eggleton is a 27 y.o. female who presents with throat swelling that began 2 weeks ago, with recent reoccurrence 3 days ago.  Patient denies any known trigger, but started lexapro about a month ago.  Was instructed by PCP to go to urgent care to get swabbed for strep throat.  Has NOT tried OTC medications.  Symptoms are made worse with swallowing and during the afternoon.  Denies previous symptoms in the past.   Denies fever, chills, fatigue, sinus pain, rhinorrhea, sore throat, SOB, wheezing, chest pain, nausea, changes in bowel or bladder habits.    ROS: As per HPI.  Past Medical History:  Diagnosis Date  . Umbilical hernia    History reviewed. No pertinent surgical history. No Known Allergies No current facility-administered medications on file prior to encounter.    Current Outpatient Medications on File Prior to Encounter  Medication Sig Dispense Refill  . escitalopram (LEXAPRO) 10 MG tablet Take 10 mg by mouth daily.    Marland Kitchen. estradiol cypionate (DEPO-ESTRADIOL) 5 MG/ML injection Inject into the muscle every 28 (twenty-eight) days.    Marland Kitchen. ibuprofen (ADVIL,MOTRIN) 600 MG tablet Take 1 tablet (600 mg total) by mouth every 6 (six) hours. 30 tablet 0   Social History   Socioeconomic History  . Marital status: Single    Spouse name: Not on file  . Number of children: 3  . Years of education: Not on file  . Highest education level: Not on file  Occupational History  . Not on file  Social Needs  . Financial resource strain: Not on file  . Food insecurity:    Worry: Not on file    Inability: Not on file  . Transportation needs:    Medical: Not on file    Non-medical: Not on file  Tobacco Use  . Smoking status: Never Smoker  . Smokeless tobacco: Never Used  Substance and Sexual Activity  . Alcohol use: No  . Drug use: No  . Sexual activity: Yes  Lifestyle  . Physical  activity:    Days per week: Not on file    Minutes per session: Not on file  . Stress: Not on file  Relationships  . Social connections:    Talks on phone: Not on file    Gets together: Not on file    Attends religious service: Not on file    Active member of club or organization: Not on file    Attends meetings of clubs or organizations: Not on file    Relationship status: Not on file  . Intimate partner violence:    Fear of current or ex partner: Not on file    Emotionally abused: Not on file    Physically abused: Not on file    Forced sexual activity: Not on file  Other Topics Concern  . Not on file  Social History Narrative  . Not on file   Family History  Problem Relation Age of Onset  . Healthy Mother   . Healthy Father     OBJECTIVE:  Vitals:   09/20/17 1402  BP: 133/77  Pulse: 71  Resp: 16  Temp: 98.7 F (37.1 C)  TempSrc: Oral  SpO2: 99%     General appearance: alert and active; not in acute distress; speaking in full sentences; tolerating own secretions HEENT: Ears: EACs clear, TMs pearly gray with visible cone of light, without erythema; Eyes: PERRL,  EOMI grossly; Sinuses nontender to palpation; Nose: no rhinorrhea; Throat: oropharynx clear, tonsils nonerythematous not enlarged without white tonsillar exudates, uvula midline Neck: supple without LAD Lungs: CTA bilaterally without adventitious breath sounds Heart: regular rate and rhythm.  Radial pulses 2+ symmetrical bilaterally Skin: warm and dry Psychological: alert and cooperative; normal mood and affect  Labs: Results for orders placed or performed during the hospital encounter of 09/20/17 (from the past 24 hour(s))  POCT rapid strep [facility])     Status: None   Collection Time: 09/20/17  2:54 PM  Result Value Ref Range   Streptococcus, Group A Screen (Direct) NEGATIVE NEGATIVE     ASSESSMENT & PLAN:  1. Throat swelling     Meds ordered this encounter  Medications  . predniSONE  (DELTASONE) 10 MG tablet    Sig: Take 2 tablets (20 mg total) by mouth 2 (two) times daily with a meal for 5 days.    Dispense:  20 tablet    Refill:  0    Order Specific Question:   Supervising Provider    Answer:   ARMYA, WESTERHOFF [[id number]]    Strep test negative, will send out for culture and we will call you with results Get plenty of rest and push fluids Follow up with PCP regarding discontinuing your lexapro Prednisone prescribed.  Take as directed and to completion Return or go to ER if patient has any new or worsening symptoms   Reviewed expectations re: course of current medical issues. Questions answered. Outlined signs and symptoms indicating need for more acute intervention. Patient verbalized understanding. After Visit Summary given.        Rennis Harding, PA-C 09/20/17 1601

## 2017-09-23 LAB — CULTURE, GROUP A STREP (THRC)

## 2017-12-16 ENCOUNTER — Other Ambulatory Visit: Payer: Self-pay

## 2017-12-16 ENCOUNTER — Encounter (HOSPITAL_COMMUNITY): Payer: Self-pay | Admitting: Emergency Medicine

## 2017-12-16 ENCOUNTER — Emergency Department (HOSPITAL_COMMUNITY)
Admission: EM | Admit: 2017-12-16 | Discharge: 2017-12-16 | Disposition: A | Discharge status: Home / Self Care | Payer: Commercial Managed Care - PPO | Attending: Emergency Medicine | Admitting: Emergency Medicine

## 2017-12-16 DIAGNOSIS — Z79899 Other long term (current) drug therapy: Secondary | ICD-10-CM | POA: Diagnosis not present

## 2017-12-16 DIAGNOSIS — L259 Unspecified contact dermatitis, unspecified cause: Secondary | ICD-10-CM | POA: Diagnosis not present

## 2017-12-16 DIAGNOSIS — R21 Rash and other nonspecific skin eruption: Secondary | ICD-10-CM | POA: Diagnosis present

## 2017-12-16 MED ORDER — HYDROCORTISONE 2.5 % EX LOTN: TOPICAL_LOTION | Freq: Two times a day (BID) | CUTANEOUS | 0 refills | Status: DC

## 2017-12-16 MED ORDER — HYDROXYZINE HCL 25 MG PO TABS: 25.0000 mg | ORAL_TABLET | Freq: Four times a day (QID) | ORAL | 0 refills | Status: DC | PRN

## 2017-12-16 NOTE — ED Notes (Signed)
Patient verbalizes understanding of discharge instructions. Opportunity for questioning and answers were provided. Armband removed by staff, pt discharged from ED ambulatory.   

## 2017-12-16 NOTE — ED Triage Notes (Signed)
Pt reports redness and sensitivity to soles of feet and palms of hands x1 week with some red bumps now appearing, pt has children and has no noticed any rashes on her children.

## 2017-12-16 NOTE — Discharge Instructions (Signed)
Use hydrocortisone cream twice daily on her hands and feet.  Take Vistaril every 6 hours as needed for itching.  Please return the emergency department if you develop any new or worsening symptoms.

## 2017-12-17 NOTE — ED Provider Notes (Signed)
[facility] EMERGENCY DEPARTMENT Provider Note   CSN: [id number] Arrival date & time: 12/16/17  1314     History   Chief Complaint Chief Complaint  Patient presents with  . Rash    HPI Shelley Moreno is a 27 y.o. female who is previously healthy who presents with a rash to her palms and soles of her feet for the past week.  Patient describes an itching and hypersensitivity to the area.  The rash is red.  She is also noticed bumps on index finger.  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  She has children, but they have not had any rash.  Patient denies any other known exposure.  She reports she did change soap 2 weeks ago, but has been using it on her whole body.  She denies any other new foods or detergents.  She does not have pets.  She denies any fevers or oral lesions.  She denies any genital lesions.  She denies any concern for STD exposure.  She has not tried anything at home for symptoms.  Patient denies staying in any new places recently.  No known tick exposure.  HPI  Past Medical History:  Diagnosis Date  . Umbilical hernia     Patient Active Problem List   Diagnosis Date Noted  . Labor abnormal 10/24/2015  . Active labor 07/19/2013  . Supervision of other normal pregnancy 07/19/2013    History reviewed. No pertinent surgical history.   OB History    Gravida  3   Para  3   Term  3   Preterm      AB      Living  3     SAB      TAB      Ectopic      Multiple  0   Live Births  3            Home Medications    Prior to Admission medications   Medication Sig Start Date End Date Taking? Authorizing Provider  escitalopram (LEXAPRO) 10 MG tablet Take 10 mg by mouth daily.    [provider]  estradiol cypionate (DEPO-ESTRADIOL) 5 MG/ML injection Inject into the muscle every 28 (twenty-eight) days.    [provider]    hydrocortisone 2.5 % lotion Apply topically 2 (two) times daily. 12/16/17   Jayon Matton, Waylan Boga, PA-C  hydrOXYzine (ATARAX/VISTARIL) 25 MG tablet Take 1 tablet (25 mg total) by mouth every 6 (six) hours as needed for itching. 12/16/17   Laterria Lasota, Waylan Boga, PA-C  ibuprofen (ADVIL,MOTRIN) 600 MG tablet Take 1 tablet (600 mg total) by mouth every 6 (six) hours. 10/25/15   Levi Aland, MD    Family History Family History  Problem Relation Age of Onset  . Healthy Mother   . Healthy Father     Social History Social History   Tobacco Use  . Smoking status: Never Smoker  . Smokeless tobacco: Never Used  Substance Use Topics  . Alcohol use: No  . Drug use: No     Allergies   Patient has no known allergies.   Review of Systems Review of Systems  Constitutional: Negative for fever.  Skin: Positive for rash.     Physical Exam Updated Vital Signs BP 117/82 (BP Location: Right Arm)   Pulse 79   Temp 98.8 F (37.1 C) (Oral)  Resp 16   SpO2 95%   Physical Exam  Constitutional: She appears well-developed and well-nourished. No distress.  HENT:  Head: Normocephalic and atraumatic.  Mouth/Throat: Oropharynx is clear and moist. No oropharyngeal exudate.  No oral lesions  Eyes: Pupils are equal, round, and reactive to light. Conjunctivae are normal. Right eye exhibits no discharge. Left eye exhibits no discharge. No scleral icterus.  Neck: Normal range of motion. Neck supple. No thyromegaly present.  Cardiovascular: Normal rate, regular rhythm, normal heart sounds and intact distal pulses. Exam reveals no gallop and no friction rub.  No murmur heard. Pulmonary/Chest: Effort normal and breath sounds normal. No stridor. No respiratory distress. She has no wheezes. She has no rales.  Abdominal: Soft. Bowel sounds are normal. She exhibits no distension. There is no tenderness. There is no rebound and no guarding.  Musculoskeletal: She exhibits no edema.  Lymphadenopathy:    She has no  cervical adenopathy.  Neurological: She is alert. Coordination normal.  Skin: Skin is warm and dry. No rash noted. She is not diaphoretic. No pallor.  Bilateral hands are erythematous and mildly tender, there are 1 to 2 mm skin colored papules lining the thumb and index finger on the radial aspect of the index finger and ulnar aspect of the thumb; feet are mildly erythematous and mildly tender, but no papules noted  Psychiatric: She has a normal mood and affect.  Nursing note and vitals reviewed.    ED Treatments / Results  Labs (all labs ordered are listed, but only abnormal results are displayed) Labs Reviewed - No data to display  EKG None  Radiology No results found.  Procedures Procedures (including critical care time)  Medications Ordered in ED Medications - No data to display   Initial Impression / Assessment and Plan / ED Course  I have reviewed the triage vital signs and the nursing notes.  Pertinent labs & imaging results that were available during my care of the patient were reviewed by me and considered in my medical decision making (see chart for details).     Suspect a contact or chemical dermatitis.  Low suspicion for syphilis or RMSF.  Patient is afebrile and denies any other symptoms.  Low suspicion for scabies.  Will cover with hydrocortisone cream and Vistaril for itching.  Return precautions discussed.  Patient understands and agrees with plan.  Patient vitals stable throughout ED course and discharged in satisfactory condition.  Patient also evaluated by my attending, Dr. Donnald Garre, who guided the patient's management and agrees with plan.  Final Clinical Impressions(s) / ED Diagnoses   Final diagnoses:  Contact dermatitis, unspecified contact dermatitis type, unspecified trigger    ED Discharge Orders         Ordered    hydrocortisone 2.5 % lotion  2 times daily     12/16/17 1552    hydrOXYzine (ATARAX/VISTARIL) 25 MG tablet  Every 6 hours PRN      12/16/17 1552           Emi Holes, PA-C 12/17/17 1147    Arby Barrette, MD 12/17/17 1519

## 2018-10-01 ENCOUNTER — Other Ambulatory Visit: Payer: Self-pay

## 2018-10-01 DIAGNOSIS — Z20822 Contact with and (suspected) exposure to covid-19: Secondary | ICD-10-CM

## 2018-10-05 LAB — NOVEL CORONAVIRUS, NAA: SARS-CoV-2, NAA: NOT DETECTED

## 2020-05-13 ENCOUNTER — Other Ambulatory Visit: Payer: Self-pay | Admitting: General Surgery

## 2020-05-13 DIAGNOSIS — K439 Ventral hernia without obstruction or gangrene: Secondary | ICD-10-CM

## 2020-05-28 ENCOUNTER — Ambulatory Visit
Admission: RE | Admit: 2020-05-28 | Discharge: 2020-05-28 | Disposition: A | Discharge status: Home / Self Care | Payer: Commercial Managed Care - PPO | Source: Ambulatory Visit | Attending: General Surgery | Admitting: General Surgery

## 2020-05-28 DIAGNOSIS — K439 Ventral hernia without obstruction or gangrene: Secondary | ICD-10-CM

## 2020-05-28 MED ORDER — IOPAMIDOL (ISOVUE-300) INJECTION 61%
100.0000 mL | Freq: Once | INTRAVENOUS | Status: AC | PRN
  Administered 2020-05-28: 100 mL via INTRAVENOUS

## 2020-06-10 ENCOUNTER — Ambulatory Visit: Payer: Self-pay | Admitting: General Surgery

## 2021-05-25 ENCOUNTER — Other Ambulatory Visit: Payer: Self-pay | Admitting: General Surgery

## 2023-09-09 ENCOUNTER — Encounter (HOSPITAL_COMMUNITY): Payer: Self-pay

## 2023-09-09 ENCOUNTER — Ambulatory Visit (HOSPITAL_COMMUNITY): Admission: EM | Admit: 2023-09-09 | Discharge: 2023-09-09 | Disposition: A | Discharge status: Home / Self Care

## 2023-09-09 DIAGNOSIS — L0291 Cutaneous abscess, unspecified: Secondary | ICD-10-CM

## 2023-09-09 MED ORDER — DOXYCYCLINE HYCLATE 100 MG PO CAPS: 100.0000 mg | ORAL_CAPSULE | Freq: Two times a day (BID) | ORAL | 0 refills | Status: DC

## 2023-09-09 MED ORDER — DOXYCYCLINE HYCLATE 100 MG PO CAPS: 100.0000 mg | ORAL_CAPSULE | Freq: Two times a day (BID) | ORAL | 0 refills | Status: AC

## 2023-09-09 NOTE — Discharge Instructions (Signed)
 Use heat therapy to help it heal  If it is growing rapidly seek care in the ER.   If it develops a squishy area, come back to have it drained  Use tylenol  or ibuprofen  as directed on the package for pain.

## 2023-09-09 NOTE — ED Triage Notes (Signed)
 Patient here today with c/o abscess on the back of her upper leg. Patient states that she noticed a little soreness on Sunday but has grown and become more painful over the week.

## 2023-09-09 NOTE — ED Provider Notes (Signed)
 [facility]    CSN: [id number] Arrival date & time: 09/09/23  1623      History   Chief Complaint Chief Complaint  Patient presents with   Abscess    HPI Shelley Moreno is a 33 y.o. female. Here for abscess. Noted small bump on posterior L thigh x1 week ago, but was [place] the beach on [place], didn't do any treatments. Getting bigger. Has not had similar problem before. Has had glucose and A1c checked within the year and they were ok. There is a central scab, black area - pt reports it bled some last night and this is the scab from dried blood.    Abscess   Past Medical History:  Diagnosis Date   Umbilical hernia     Patient Active Problem List   Diagnosis Date Noted   Labor abnormal 10/24/2015   Active labor 07/19/2013   Supervision of other normal pregnancy 07/19/2013    History reviewed. No pertinent surgical history.  OB History     Gravida  3   Para  3   Term  3   Preterm      AB      Living  3      SAB      IAB      Ectopic      Multiple  0   Live Births  3            Home Medications    Prior to Admission medications   Medication Sig Start Date End Date Taking? Authorizing Provider  medroxyPROGESTERone Acetate (DEPO-PROVERA) 150 MG/ML SUSY 150 mg q 3 months 04/19/23  Yes [provider]  omeprazole (PRILOSEC) 20 MG capsule Take 20 mg by mouth daily.   Yes [provider]  Cholecalciferol 1.25 MG (50000 UT) capsule Take 50,000 Units by mouth once a week.    [provider]  doxycycline (VIBRAMYCIN) 100 MG capsule Take 1 capsule (100 mg total) by mouth 2 (two) times daily. 09/09/23   Richad Jon CHRISTELLA, NP  rosuvastatin (CRESTOR) 40 MG tablet Take 40 mg by mouth daily.    [provider]    Family History Family History  Problem Relation Age of Onset   Healthy Mother    Healthy Father     Social History Social History   Tobacco Use   Smoking status: Never   Smokeless tobacco:  Never  Vaping Use   Vaping status: Never Used  Substance Use Topics   Alcohol use: Yes    Comment: occasionally   Drug use: No     Allergies   Escitalopram   Review of Systems Review of Systems   Physical Exam Triage Vital Signs ED Triage Vitals  Encounter Vitals Group     BP 09/09/23 1737 130/81     Girls Systolic BP Percentile --      Girls Diastolic BP Percentile --      Boys Systolic BP Percentile --      Boys Diastolic BP Percentile --      Pulse Rate 09/09/23 1737 88     Resp 09/09/23 1737 16     Temp 09/09/23 1737 98.7 F (37.1 C)     Temp Source 09/09/23 1737 Oral     SpO2 09/09/23 1737 97 %     Weight --      Height --      Head Circumference --      Peak Flow --  Pain Score 09/09/23 1739 8     Pain Loc --      Pain Education --      Exclude from Growth Chart --    No data found.  Updated Vital Signs BP 130/81 (BP Location: Right Arm)   Pulse 88   Temp 98.7 F (37.1 C) (Oral)   Resp 16   LMP 09/03/2023 (Approximate)   SpO2 97%   Breastfeeding No   Visual Acuity Right Eye Distance:   Left Eye Distance:   Bilateral Distance:    Right Eye Near:   Left Eye Near:    Bilateral Near:     Physical Exam Constitutional:      Appearance: Normal appearance.  Pulmonary:     Effort: Pulmonary effort is normal.   Skin:    Findings: Abscess and erythema present.         Comments: ~3cm diameter hard, indurated area with central black scab. Has some surrounding erythema. No area of fluctuance. Is tender to palpation   Neurological:     Mental Status: She is alert.      UC Treatments / Results  Labs (all labs ordered are listed, but only abnormal results are displayed) Labs Reviewed - No data to display  EKG   Radiology No results found.  Procedures Procedures (including critical care time)  Medications Ordered in UC Medications - No data to display  Initial Impression / Assessment and Plan / UC Course  I have reviewed the  triage vital signs and the nursing notes.  Pertinent labs & imaging results that were available during my care of the patient were reviewed by me and considered in my medical decision making (see chart for details).    No area of fluctuance. Pt to apply heat several times a day and start antibiotics. Reviewed reasons for returning including for possible I&D or if it is spreading/worsening.   Final Clinical Impressions(s) / UC Diagnoses   Final diagnoses:  Abscess     Discharge Instructions      Use heat therapy to help it heal  If it is growing rapidly seek care in the ER.   If it develops a squishy area, come back to have it drained  Use tylenol  or ibuprofen  as directed on the package for pain.      ED Prescriptions     Medication Sig Dispense Auth. Provider   doxycycline (VIBRAMYCIN) 100 MG capsule  (Status: Discontinued) Take 1 capsule (100 mg total) by mouth 2 (two) times daily. 20 capsule Richad Jon HERO, NP   doxycycline (VIBRAMYCIN) 100 MG capsule Take 1 capsule (100 mg total) by mouth 2 (two) times daily. 20 capsule Richad Jon HERO, NP      PDMP not reviewed this encounter.   Richad Jon HERO, NP 09/10/23 1009
